# Patient Record
Sex: Female | Born: 1970
Health system: Southern US, Community
[De-identification: ages and names within clinical notes are randomized; demographics above are authoritative.]

## PROBLEM LIST (undated history)

## (undated) DIAGNOSIS — R519 Headache, unspecified: Secondary | ICD-10-CM

## (undated) DIAGNOSIS — F419 Anxiety disorder, unspecified: Secondary | ICD-10-CM

## (undated) DIAGNOSIS — R51 Headache: Secondary | ICD-10-CM

## (undated) DIAGNOSIS — D649 Anemia, unspecified: Secondary | ICD-10-CM

## (undated) DIAGNOSIS — K219 Gastro-esophageal reflux disease without esophagitis: Secondary | ICD-10-CM

## (undated) DIAGNOSIS — J45909 Unspecified asthma, uncomplicated: Secondary | ICD-10-CM

## (undated) DIAGNOSIS — Z8489 Family history of other specified conditions: Secondary | ICD-10-CM

## (undated) HISTORY — DX: Headache: R51

## (undated) HISTORY — DX: Headache, unspecified: R51.9

## (undated) HISTORY — PX: TUBAL LIGATION: SHX77

---

## 2011-02-16 HISTORY — PX: ABDOMINAL HYSTERECTOMY: SHX81

## 2011-12-29 ENCOUNTER — Encounter (HOSPITAL_COMMUNITY): Payer: Self-pay | Admitting: Pharmacist

## 2011-12-30 NOTE — H&P (Addendum)
   Morning presents today for GYN evaluation and second opinion for hysterectomy.  Followed by Dr. Allena Katz in [facility].  She has recently moved.  She does like Dr. Allena Katz a lot but finds that if she needs surgery, it may be better to do it here at [facility].  I reviewed the records with the patient.  It does show that she has had worsening problems with pain and menorrhagia.  Recent ultrasound from October 14 shows a 9.1 x 6.3 cm uterus with several large fibroids, the largest measuring 4.9 cm in size and several 6 mm intramural fibroids.  Normal-appearing ovaries.  I reviewed her other medical records.  Past medical history:  She does have multiple allergies including erythromycin, sulfa and doxycycline, but she can take penicillin.  She has had two Cesarean sections.  They had recommended a hysterectomy, most likely robotic.  She does want a hysterectomy.   A&P: Pelvic pain, menorrhagia, 10-week size fibroids.  Discussed different options.  She has had tubal ligation and she wants to proceed with surgical options such as hysterectomy.  Plan laparoscopically assisted vaginal hysterectomy with preservation of the ovaries.  Discussed the risks and benefits of the procedure, the time out of work.  She does want to proceed as soon as possible.  She did have a history of anemia with a hemoglobin down to 8.  It has improved to 11 on intensive iron therapy as well.   This patient has been seen and examined.   All of her questions were answered.  Labs and vital signs reviewed.  Informed consent has been obtained.  The History and Physical is current.  01/12/12 DL

## 2012-01-04 ENCOUNTER — Encounter (HOSPITAL_COMMUNITY): Payer: Self-pay

## 2012-01-04 ENCOUNTER — Encounter (HOSPITAL_COMMUNITY)
Admission: RE | Admit: 2012-01-04 | Discharge: 2012-01-04 | Disposition: A | Discharge status: Home / Self Care | Payer: BC Managed Care – PPO | Source: Ambulatory Visit | Attending: Obstetrics and Gynecology | Admitting: Obstetrics and Gynecology

## 2012-01-04 ENCOUNTER — Other Ambulatory Visit: Payer: Self-pay | Admitting: Obstetrics and Gynecology

## 2012-01-04 DIAGNOSIS — N63 Unspecified lump in unspecified breast: Secondary | ICD-10-CM

## 2012-01-04 HISTORY — DX: Family history of other specified conditions: Z84.89

## 2012-01-04 HISTORY — DX: Unspecified asthma, uncomplicated: J45.909

## 2012-01-04 HISTORY — DX: Anemia, unspecified: D64.9

## 2012-01-04 HISTORY — DX: Anxiety disorder, unspecified: F41.9

## 2012-01-04 HISTORY — DX: Gastro-esophageal reflux disease without esophagitis: K21.9

## 2012-01-04 LAB — CBC
MCV: 89.8 fL (ref 78.0–100.0)
Platelets: 247 10*3/uL (ref 150–400)
RBC: 4.13 MIL/uL (ref 3.87–5.11)
RDW: 15 % (ref 11.5–15.5)
WBC: 5.5 10*3/uL (ref 4.0–10.5)

## 2012-01-04 LAB — SURGICAL PCR SCREEN
MRSA, PCR: NEGATIVE
Staphylococcus aureus: NEGATIVE

## 2012-01-04 NOTE — Patient Instructions (Addendum)
Your procedure is scheduled on:01/12/12  Enter through the Main Entrance at :6am Pick up desk phone and dial [phone] and inform us of your arrival.  Please call [phone] if you have any problems the morning of surgery.  Remember: Do not eat or drink after midnight:Tuesday   Take these meds the morning of surgery with a sip of water: Zegrid  DO NOT wear jewelry, eye make-up, lipstick,body lotion, or dark fingernail polish. Do not shave for 48 hours prior to surgery.  If you are to be admitted after surgery, leave suitcase in car until your room has been assigned.9

## 2012-01-12 ENCOUNTER — Encounter (HOSPITAL_COMMUNITY): Payer: Self-pay | Admitting: *Deleted

## 2012-01-12 ENCOUNTER — Observation Stay (HOSPITAL_COMMUNITY)
Admission: RE | Admit: 2012-01-12 | Discharge: 2012-01-13 | Disposition: A | Discharge status: Home / Self Care | Payer: BC Managed Care – PPO | Source: Ambulatory Visit | Attending: Obstetrics and Gynecology | Admitting: Obstetrics and Gynecology

## 2012-01-12 ENCOUNTER — Ambulatory Visit (HOSPITAL_COMMUNITY): Payer: BC Managed Care – PPO | Admitting: Anesthesiology

## 2012-01-12 ENCOUNTER — Encounter (HOSPITAL_COMMUNITY)
Admission: RE | Disposition: A | Discharge status: Home / Self Care | Payer: Self-pay | Source: Ambulatory Visit | Attending: Obstetrics and Gynecology

## 2012-01-12 ENCOUNTER — Encounter (HOSPITAL_COMMUNITY): Payer: Self-pay | Admitting: Anesthesiology

## 2012-01-12 DIAGNOSIS — D649 Anemia, unspecified: Secondary | ICD-10-CM | POA: Insufficient documentation

## 2012-01-12 DIAGNOSIS — N938 Other specified abnormal uterine and vaginal bleeding: Principal | ICD-10-CM | POA: Insufficient documentation

## 2012-01-12 DIAGNOSIS — N949 Unspecified condition associated with female genital organs and menstrual cycle: Principal | ICD-10-CM | POA: Insufficient documentation

## 2012-01-12 DIAGNOSIS — N920 Excessive and frequent menstruation with regular cycle: Secondary | ICD-10-CM | POA: Insufficient documentation

## 2012-01-12 DIAGNOSIS — IMO0002 Reserved for concepts with insufficient information to code with codable children: Secondary | ICD-10-CM | POA: Insufficient documentation

## 2012-01-12 DIAGNOSIS — D251 Intramural leiomyoma of uterus: Secondary | ICD-10-CM | POA: Insufficient documentation

## 2012-01-12 DIAGNOSIS — Z90710 Acquired absence of both cervix and uterus: Secondary | ICD-10-CM

## 2012-01-12 HISTORY — PX: LAPAROSCOPIC ASSISTED VAGINAL HYSTERECTOMY: SHX5398

## 2012-01-12 SURGERY — HYSTERECTOMY, VAGINAL, LAPAROSCOPY-ASSISTED
Anesthesia: General | Site: Abdomen | Wound class: Clean Contaminated

## 2012-01-12 MED ORDER — NEOSTIGMINE METHYLSULFATE 1 MG/ML IJ SOLN
INTRAMUSCULAR | Status: DC | PRN
  Administered 2012-01-12: 1 mg via INTRAVENOUS

## 2012-01-12 MED ORDER — LIDOCAINE HCL (CARDIAC) 20 MG/ML IV SOLN
INTRAVENOUS | Status: AC
  Filled 2012-01-12: qty 5

## 2012-01-12 MED ORDER — MIDAZOLAM HCL 2 MG/2ML IJ SOLN: 0.5000 mg | Freq: Once | INTRAMUSCULAR | Status: DC | PRN

## 2012-01-12 MED ORDER — NALOXONE HCL 0.4 MG/ML IJ SOLN: 0.4000 mg | INTRAMUSCULAR | Status: DC | PRN

## 2012-01-12 MED ORDER — FENTANYL CITRATE 0.05 MG/ML IJ SOLN
INTRAMUSCULAR | Status: AC
  Filled 2012-01-12: qty 5

## 2012-01-12 MED ORDER — FLUOXETINE HCL 20 MG PO CAPS
20.0000 mg | ORAL_CAPSULE | Freq: Every day | ORAL | Status: DC
  Administered 2012-01-13: 20 mg via ORAL
  Filled 2012-01-12 (×2): qty 1

## 2012-01-12 MED ORDER — DEXTROSE-NACL 5-0.45 % IV SOLN
INTRAVENOUS | Status: DC
  Administered 2012-01-12 – 2012-01-13 (×3): via INTRAVENOUS

## 2012-01-12 MED ORDER — PROPOFOL 10 MG/ML IV EMUL
INTRAVENOUS | Status: AC
  Filled 2012-01-12: qty 20

## 2012-01-12 MED ORDER — GLYCOPYRROLATE 0.2 MG/ML IJ SOLN
INTRAMUSCULAR | Status: AC
  Filled 2012-01-12: qty 2

## 2012-01-12 MED ORDER — ONDANSETRON HCL 4 MG/2ML IJ SOLN
INTRAMUSCULAR | Status: DC | PRN
  Administered 2012-01-12: 4 mg via INTRAVENOUS

## 2012-01-12 MED ORDER — ACETAMINOPHEN 10 MG/ML IV SOLN
INTRAVENOUS | Status: AC
  Administered 2012-01-12: 1000 mg via INTRAVENOUS
  Filled 2012-01-12: qty 100

## 2012-01-12 MED ORDER — FENTANYL CITRATE 0.05 MG/ML IJ SOLN
25.0000 ug | INTRAMUSCULAR | Status: DC | PRN
  Administered 2012-01-12: 50 ug via INTRAVENOUS

## 2012-01-12 MED ORDER — GLYCOPYRROLATE 0.2 MG/ML IJ SOLN
INTRAMUSCULAR | Status: DC | PRN
  Administered 2012-01-12: 0.2 mg via INTRAVENOUS

## 2012-01-12 MED ORDER — ZOLPIDEM TARTRATE 5 MG PO TABS: 5.0000 mg | ORAL_TABLET | Freq: Every evening | ORAL | Status: DC | PRN

## 2012-01-12 MED ORDER — PROPOFOL 10 MG/ML IV EMUL
INTRAVENOUS | Status: DC | PRN
  Administered 2012-01-12: 120 mg via INTRAVENOUS

## 2012-01-12 MED ORDER — OXYCODONE-ACETAMINOPHEN 5-325 MG PO TABS: 1.0000 | ORAL_TABLET | ORAL | Status: DC | PRN

## 2012-01-12 MED ORDER — BUPIVACAINE HCL (PF) 0.25 % IJ SOLN
INTRAMUSCULAR | Status: AC
  Filled 2012-01-12: qty 30

## 2012-01-12 MED ORDER — HYDROMORPHONE HCL PF 1 MG/ML IJ SOLN: 0.2000 mg | INTRAMUSCULAR | Status: DC | PRN

## 2012-01-12 MED ORDER — DIPHENHYDRAMINE HCL 50 MG/ML IJ SOLN: 12.5000 mg | Freq: Four times a day (QID) | INTRAMUSCULAR | Status: DC | PRN

## 2012-01-12 MED ORDER — BUPIVACAINE HCL (PF) 0.25 % IJ SOLN
INTRAMUSCULAR | Status: DC | PRN
  Administered 2012-01-12: 10 mL

## 2012-01-12 MED ORDER — DIPHENHYDRAMINE HCL 12.5 MG/5ML PO ELIX: 12.5000 mg | ORAL_SOLUTION | Freq: Four times a day (QID) | ORAL | Status: DC | PRN

## 2012-01-12 MED ORDER — NEOSTIGMINE METHYLSULFATE 1 MG/ML IJ SOLN
INTRAMUSCULAR | Status: AC
  Filled 2012-01-12: qty 10

## 2012-01-12 MED ORDER — MIDAZOLAM HCL 2 MG/2ML IJ SOLN
INTRAMUSCULAR | Status: AC
  Filled 2012-01-12: qty 2

## 2012-01-12 MED ORDER — MENTHOL 3 MG MT LOZG: 1.0000 | LOZENGE | OROMUCOSAL | Status: DC | PRN

## 2012-01-12 MED ORDER — LACTATED RINGERS IV SOLN
INTRAVENOUS | Status: DC
  Administered 2012-01-12 (×3): via INTRAVENOUS

## 2012-01-12 MED ORDER — MEPERIDINE HCL 25 MG/ML IJ SOLN: 6.2500 mg | INTRAMUSCULAR | Status: DC | PRN

## 2012-01-12 MED ORDER — PROMETHAZINE HCL 25 MG/ML IJ SOLN
6.2500 mg | INTRAMUSCULAR | Status: DC | PRN
  Administered 2012-01-12: 6.25 mg via INTRAVENOUS

## 2012-01-12 MED ORDER — DEXTROSE 5 % IV SOLN
2.0000 g | INTRAVENOUS | Status: AC
  Administered 2012-01-12: 2 g via INTRAVENOUS
  Filled 2012-01-12: qty 2

## 2012-01-12 MED ORDER — ONDANSETRON 4 MG PO TBDP
4.0000 mg | ORAL_TABLET | Freq: Four times a day (QID) | ORAL | Status: DC | PRN
  Filled 2012-01-12: qty 1

## 2012-01-12 MED ORDER — MIDAZOLAM HCL 5 MG/5ML IJ SOLN
INTRAMUSCULAR | Status: DC | PRN
  Administered 2012-01-12: 2 mg via INTRAVENOUS

## 2012-01-12 MED ORDER — ROCURONIUM BROMIDE 50 MG/5ML IV SOLN
INTRAVENOUS | Status: AC
  Filled 2012-01-12: qty 1

## 2012-01-12 MED ORDER — DEXTROSE 5 % IV SOLN
2.0000 g | Freq: Once | INTRAVENOUS | Status: DC
  Filled 2012-01-12: qty 2

## 2012-01-12 MED ORDER — PROMETHAZINE HCL 25 MG/ML IJ SOLN
INTRAMUSCULAR | Status: AC
  Administered 2012-01-12: 6.25 mg via INTRAVENOUS
  Filled 2012-01-12: qty 1

## 2012-01-12 MED ORDER — CLONAZEPAM 0.5 MG PO TABS
0.5000 mg | ORAL_TABLET | Freq: Every day | ORAL | Status: DC
  Administered 2012-01-12: 0.25 mg via ORAL
  Filled 2012-01-12: qty 1

## 2012-01-12 MED ORDER — SODIUM CHLORIDE 0.9 % IJ SOLN: 9.0000 mL | INTRAMUSCULAR | Status: DC | PRN

## 2012-01-12 MED ORDER — FENTANYL CITRATE 0.05 MG/ML IJ SOLN
INTRAMUSCULAR | Status: AC
  Administered 2012-01-12: 50 ug via INTRAVENOUS
  Filled 2012-01-12: qty 2

## 2012-01-12 MED ORDER — ONDANSETRON HCL 4 MG/2ML IJ SOLN
INTRAMUSCULAR | Status: AC
  Filled 2012-01-12: qty 2

## 2012-01-12 MED ORDER — FENTANYL CITRATE 0.05 MG/ML IJ SOLN
INTRAMUSCULAR | Status: DC | PRN
  Administered 2012-01-12 (×5): 50 ug via INTRAVENOUS

## 2012-01-12 MED ORDER — HYDROMORPHONE 0.3 MG/ML IV SOLN
INTRAVENOUS | Status: DC
  Administered 2012-01-12 (×2): 0.2 mg via INTRAVENOUS
  Administered 2012-01-12: 12:00:00 via INTRAVENOUS
  Administered 2012-01-13: 0.2 mg via INTRAVENOUS
  Filled 2012-01-12: qty 25

## 2012-01-12 MED ORDER — LACTATED RINGERS IR SOLN
Status: DC | PRN
  Administered 2012-01-12: 3000 mL

## 2012-01-12 MED ORDER — ROCURONIUM BROMIDE 100 MG/10ML IV SOLN
INTRAVENOUS | Status: DC | PRN
  Administered 2012-01-12: 5 mg via INTRAVENOUS
  Administered 2012-01-12 (×2): 10 mg via INTRAVENOUS
  Administered 2012-01-12: 35 mg via INTRAVENOUS

## 2012-01-12 MED ORDER — ONDANSETRON HCL 4 MG/2ML IJ SOLN: 4.0000 mg | Freq: Four times a day (QID) | INTRAMUSCULAR | Status: DC | PRN

## 2012-01-12 MED ORDER — ACETAMINOPHEN 10 MG/ML IV SOLN
1000.0000 mg | Freq: Four times a day (QID) | INTRAVENOUS | Status: DC
  Administered 2012-01-12 (×2): 1000 mg via INTRAVENOUS

## 2012-01-12 SURGICAL SUPPLY — 40 items
BLADE SURG 15 STRL LF C SS BP (BLADE) ×1 IMPLANT
BLADE SURG 15 STRL SS (BLADE) ×1
CABLE HIGH FREQUENCY MONO STRZ (ELECTRODE) IMPLANT
CATH ROBINSON RED A/P 16FR (CATHETERS) ×2 IMPLANT
CLOTH BEACON ORANGE TIMEOUT ST (SAFETY) ×2 IMPLANT
CONT PATH 16OZ SNAP LID 3702 (MISCELLANEOUS) ×2 IMPLANT
COVER TABLE BACK 60X90 (DRAPES) ×2 IMPLANT
DECANTER SPIKE VIAL GLASS SM (MISCELLANEOUS) IMPLANT
DERMABOND ADVANCED (GAUZE/BANDAGES/DRESSINGS) ×1
DERMABOND ADVANCED .7 DNX12 (GAUZE/BANDAGES/DRESSINGS) ×1 IMPLANT
ELECT LIGASURE LONG (ELECTRODE) ×2 IMPLANT
ELECT REM PT RETURN 9FT ADLT (ELECTROSURGICAL) ×2
ELECTRODE REM PT RTRN 9FT ADLT (ELECTROSURGICAL) ×1 IMPLANT
FORCEPS CUTTING 45CM 5MM (CUTTING FORCEPS) ×2 IMPLANT
GLOVE BIO SURGEON STRL SZ8 (GLOVE) ×2 IMPLANT
GLOVE BIOGEL PI IND STRL 6.5 (GLOVE) ×1 IMPLANT
GLOVE BIOGEL PI INDICATOR 6.5 (GLOVE) ×1
GLOVE SURG ORTHO 8.0 STRL STRW (GLOVE) ×6 IMPLANT
GOWN STRL REIN XL XLG (GOWN DISPOSABLE) ×8 IMPLANT
NEEDLE INSUFFLATION 14GA 120MM (NEEDLE) ×2 IMPLANT
NS IRRIG 1000ML POUR BTL (IV SOLUTION) ×2 IMPLANT
PACK LAVH (CUSTOM PROCEDURE TRAY) ×2 IMPLANT
PROTECTOR NERVE ULNAR (MISCELLANEOUS) ×2 IMPLANT
SET IRRIG TUBING LAPAROSCOPIC (IRRIGATION / IRRIGATOR) ×2 IMPLANT
SOLUTION ELECTROLUBE (MISCELLANEOUS) IMPLANT
STRIP CLOSURE SKIN 1/4X3 (GAUZE/BANDAGES/DRESSINGS) IMPLANT
SUT MNCRL 0 MO-4 VIOLET 18 CR (SUTURE) ×2 IMPLANT
SUT MNCRL 0 VIOLET 6X18 (SUTURE) ×1 IMPLANT
SUT MNCRL AB 0 CT1 27 (SUTURE) IMPLANT
SUT MON AB 2-0 CT1 36 (SUTURE) IMPLANT
SUT MONOCRYL 0 6X18 (SUTURE) ×1
SUT MONOCRYL 0 MO 4 18  CR/8 (SUTURE) ×2
SUT VICRYL 0 UR6 27IN ABS (SUTURE) ×2 IMPLANT
SUT VICRYL RAPIDE 3 0 (SUTURE) ×2 IMPLANT
TOWEL OR 17X24 6PK STRL BLUE (TOWEL DISPOSABLE) ×4 IMPLANT
TRAY FOLEY CATH 14FR (SET/KITS/TRAYS/PACK) ×2 IMPLANT
TROCAR Z-THREAD BLADED 11X100M (TROCAR) ×2 IMPLANT
TROCAR Z-THREAD BLADED 5X100MM (TROCAR) ×2 IMPLANT
WARMER LAPAROSCOPE (MISCELLANEOUS) ×2 IMPLANT
WATER STERILE IRR 1000ML POUR (IV SOLUTION) IMPLANT

## 2012-01-12 NOTE — Brief Op Note (Addendum)
01/12/2012  9:23 AM  PATIENT:  Amanda Ho  41 y.o. female  PRE-OPERATIVE DIAGNOSIS:  10 week fibroids, AUB, anemia,menorrhagia,dyspareunia  POST-OPERATIVE DIAGNOSIS:  Ten week fibroids, Abnormal Uterine Bleeding, Anemia, menorrhagia, dyspareunia  PROCEDURE:  Procedure(s) (LRB) with comments: LAPAROSCOPIC ASSISTED VAGINAL HYSTERECTOMY (N/A)  SURGEON:  Surgeon(s) and Role:    * Turner Daniels, MD - Primary    * Mitchel Honour, DO - Assisting  PHYSICIAN ASSISTANT:   ASSISTANTS: none   ANESTHESIA:   general  EBL:  Total I/O In: 1300 [I.V.:1300] Out: 500 [Urine:300; Blood:200]  BLOOD ADMINISTERED:none  DRAINS: Urinary Catheter (Foley)   LOCAL MEDICATIONS USED:  MARCAINE     SPECIMEN:  Source of Specimen:  uterus  DISPOSITION OF SPECIMEN:  PATHOLOGY  COUNTS:  YES  TOURNIQUET:  * No tourniquets in log *  DICTATION: .Other Dictation: Dictation Number [id number]  PLAN OF CARE: Admit for overnight observation  PATIENT DISPOSITION:  PACU - hemodynamically stable.   Delay start of Pharmacological VTE agent (>24hrs) due to surgical blood loss or risk of bleeding: N/A

## 2012-01-12 NOTE — Addendum Note (Signed)
Addendum  created 01/12/12 1204 by Collier Flowers, CRNA   Modules edited:Anesthesia Responsible Staff

## 2012-01-12 NOTE — Addendum Note (Signed)
Addendum  created 01/12/12 1143 by Zaiyden Strozier J Nyheim Seufert, CRNA   Modules edited:Notes Section    

## 2012-01-12 NOTE — Addendum Note (Signed)
Addendum  created 01/12/12 1143 by Collier Flowers, CRNA   Modules edited:Notes Section

## 2012-01-12 NOTE — Anesthesia Postprocedure Evaluation (Signed)
  Anesthesia Post-op Note  Patient: Amanda Ho  Procedure(s) Performed: Procedure(s) (LRB) with comments: LAPAROSCOPIC ASSISTED VAGINAL HYSTERECTOMY (N/A)  Patient Location: [facility]  Anesthesia Type:General  Level of Consciousness: awake, alert  and oriented  Airway and Oxygen Therapy: Patient Spontanous Breathing and Patient connected to nasal cannula oxygen  Post-op Pain: none  Post-op Assessment: Post-op Vital signs reviewed and Patient's Cardiovascular Status Stable  Post-op Vital Signs: Reviewed and stable  Complications: No apparent anesthesia complications

## 2012-01-12 NOTE — Addendum Note (Signed)
Addendum  created 01/12/12 1800 by Shanon Payor, CRNA   Modules edited:Notes Section

## 2012-01-12 NOTE — Anesthesia Postprocedure Evaluation (Signed)
Anesthesia Post Note  Patient: Amanda Ho  Procedure(s) Performed: Procedure(s) (LRB): LAPAROSCOPIC ASSISTED VAGINAL HYSTERECTOMY (N/A)  Anesthesia type: General  Patient location: PACU  Post pain: Pain level controlled  Post assessment: Post-op Vital signs reviewed  Last Vitals:  Filed Vitals:   01/12/12 1100  BP: 116/61  Pulse: 70  Temp:   Resp: 12    Post vital signs: Reviewed  Level of consciousness: sedated  Complications: No apparent anesthesia complicationsfj

## 2012-01-12 NOTE — Preoperative (Addendum)
Beta Blockers   Reason not to administer Beta Blockers:Not Applicable 

## 2012-01-12 NOTE — Transfer of Care (Signed)
Immediate Anesthesia Transfer of Care Note  Patient: Amanda Ho  Procedure(s) Performed: Procedure(s) (LRB) with comments: LAPAROSCOPIC ASSISTED VAGINAL HYSTERECTOMY (N/A)  Patient Location: PACU  Anesthesia Type:General  Level of Consciousness: awake, alert , oriented and patient cooperative  Airway & Oxygen Therapy: Patient Spontanous Breathing and Patient connected to nasal cannula oxygen  Post-op Assessment: Report given to PACU RN and Post -op Vital signs reviewed and stable  Post vital signs: Reviewed and stable  Complications: No apparent anesthesia complications32

## 2012-01-12 NOTE — Op Note (Signed)
[name], Ho NO.:  [id number]  MEDICAL RECORD NO.:  [id number]  LOCATION:  9318                          FACILITY:  [facility]  PHYSICIAN:  Dineen Kid. Rana Snare, M.D.    DATE OF BIRTH:  09-04-1970  DATE OF PROCEDURE:  01/12/2012 DATE OF DISCHARGE:                              OPERATIVE REPORT   PREOPERATIVE DIAGNOSIS:  Ten week size fibroids, abnormal uterine bleeding, anemia, menorrhagia, and dyspareunia.  POSTOPERATIVE DIAGNOSIS:  Ten week size fibroids, abnormal uterine bleeding, anemia, menorrhagia, and dyspareunia.  PROCEDURE:  Laparoscopic-assisted vaginal hysterectomy with lysis of adhesions.  SURGEON:  Dineen Kid. Rana Snare, MD  ASSISTANT:  Dr. Gillian Scarce.  ANESTHESIA:  General endotracheal.  INDICATIONS:  [name] is a 41 year old status post tubal ligation, who has been followed by Dr. Allena Katz in [facility].  She was rescheduled for hysterectomy but had to move.  Had been having worsening problems with menorrhagia and anemia, currently on intensive iron therapy.  Recent ultrasound shows a 9.1 cm uterus with several large fibroids, the largest measuring 4.9 cm in size.  She has also continued to have pelvic pain and dyspareunia.  She desires definitive surgical intervention and requests hysterectomy.  Plan to proceed with laparoscopic-assisted vaginal hysterectomy, preservation of the ovaries.  Discussed the risks, benefits, the procedure at length.  She does give her informed consent and wished to proceed.  FINDINGS:  At the time of surgery normal-appearing liver, normal- appearing gallbladder, normal-appearing appendix.  The ovaries appeared to be normal.  The uterus is grossly enlarged, approximately 10 week size, slightly boggy in nature, and irregular consistent with fibroids. There are some epiploic adhesions to the left tubo-ovarian complex and the posterior wall of the uterus, otherwise normal-appearing bowel.  DESCRIPTION OF PROCEDURE:  After  adequate analgesia, the patient was placed in the dorsal lithotomy position.  She was sterilely prepped and draped.  Bladder sterilely drained.  Graves speculum was placed.  A Cohen tenaculum was placed on the cervix.  A 1 cm infraumbilical skin incision was made.  A Veress needle was inserted.  The abdomen was insufflated with dullness to percussion.  An 11 mm trocar was inserted. The above findings were noted by laparoscope.  A 5 mm trocar was inserted left of the midline 2 fingerbreadths above the pubic symphysis. The Gyrus cutting forceps were used to dissect and remove the epiploic adhesions from the posterior wall of the uterus and the left tubo- ovarian complex.  Care taken to avoid injury to the bowel, and she had good hemostasis.  The left utero-ovarian ligament was then ligated and dissected down across the broad ligament, including the round ligament with the left tube and ovary falling to the lateral sidewall.  Good hemostasis achieved and care taken to avoid not only bowel injury but also to avoid injury to ureter.  This was done similarly on the right side, dissecting across the utero-ovarian ligament down across the round and the inferior portion of broad ligament with the right tube and ovary falling laterally with care taken to avoid injury to the ureter or bowel and achieve good hemostasis.  The bladder was then elevated and a small window was made  at the uterovesical junction creating a small bladder flap.  The abdomen was desufflated, legs repositioned.  A posterior weighted speculum was placed in the vagina.  Posterior colpotomy was then performed.  The cervix then circumscribed with Bovie cautery. LigaSure instrument was used to ligate across the uterosacral ligaments bilaterally, cardinal ligaments bilaterally, bladder pillars bilaterally and dissected with Mayo scissors.  The bladder was then dissected off the anterior surface of the cervix.  The anterior peritoneum  was entered sharply and a Deaver retractor was placed underneath the bladder.  The uterine vasculature was then ligated with LigaSure instrument, dissected with Mayo scissors to the inferior portion of the broad ligament using ligature and Mayo scissors.  The uterus then removed intact.  The uterosacral ligaments were identified, ligated with a figure-of-eight of 0 Monocryl suture bilaterally.  The posterior peritoneum was then closed in a pursestring fashion using 0 Monocryl suture.  The vagina was then closed by plicating the uterosacral ligaments in the midline in a vertical fashion closing the vagina with figure-of-eights of 0 Monocryl suture with good approximation and good hemostasis achieved.  Foley catheter was placed with return of clear yellow urine.  That was then re- insufflated.  The laparoscope was inserted.  Nezhat suction irrigator was used to irrigate the abdomen and pelvis.  Small peritoneal bleeders along the bladder were cauterized using bipolar cautery, achieving good hemostasis.  Reexamination revealed good hemostasis of all the pedicles. No bleeding was noted after meticulous evaluation of the small bleeders and use of bipolar cautery.  Ureter was identified bilaterally with good peristalsis noted.  After copious irrigation, adequate hemostasis was assured, the abdomen was then desufflated, trocars removed.  The infraumbilical skin incision was closed with 0 Vicryl interrupted suture in the fascia, 3-0 Vicryl Rapide subcuticular suture.  The 5 mm site was closed with 3-0 Vicryl Rapide subcuticular suture and Dermabond.  The incisions were injected with 0.25% Marcaine, total 10 mL used.  The patient transferred to recovery room in stable condition.  Sponge and instrument count was normal x3.  Estimated blood loss 200 mL.  The patient received 2 g of cefotetan preoperatively.  DISPOSITION:  The patient will be placed on the 3rd floor for overnight observation in  stable condition.     Dineen Kid Rana Snare, M.D.     DCL/MEDQ  D:  01/12/2012  T:  01/12/2012  Job:  [id number]

## 2012-01-12 NOTE — Anesthesia Preprocedure Evaluation (Signed)
Anesthesia Evaluation  Patient identified by MRN, date of birth, ID band Patient awake    Reviewed: Allergy & Precautions, H&P , Patient's Chart, lab work & pertinent test results, reviewed documented beta blocker date and time   History of Anesthesia Complications (+) Family history of anesthesia reaction  Airway Mallampati: II TM Distance: >3 FB Neck ROM: full    Dental No notable dental hx.    Pulmonary neg pulmonary ROS, asthma ,  breath sounds clear to auscultation  Pulmonary exam normal       Cardiovascular Exercise Tolerance: Good negative cardio ROS  Rhythm:regular Rate:Normal     Neuro/Psych  Headaches, negative neurological ROS  negative psych ROS   GI/Hepatic negative GI ROS, Neg liver ROS, GERD-  Controlled,  Endo/Other  negative endocrine ROS  Renal/GU negative Renal ROS     Musculoskeletal   Abdominal   Peds  Hematology negative hematology ROS (+)   Anesthesia Other Findings GERD (gastroesophageal reflux disease)     Anxiety        Asthma   rarely with URIs Headache        Anemia     Family history of anesthesia complication   Vertigo postop    Reproductive/Obstetrics negative OB ROS                           Anesthesia Physical Anesthesia Plan  ASA: II  Anesthesia Plan: General ETT   Post-op Pain Management:    Induction:   Airway Management Planned:   Additional Equipment:   Intra-op Plan:   Post-operative Plan:   Informed Consent: I have reviewed the patients History and Physical, chart, labs and discussed the procedure including the risks, benefits and alternatives for the proposed anesthesia with the patient or authorized representative who has indicated his/her understanding and acceptance.   Dental Advisory Given  Plan Discussed with: CRNA and Surgeon  Anesthesia Plan Comments:         Anesthesia Quick Evaluation

## 2012-01-12 NOTE — Addendum Note (Signed)
Addendum  created 01/12/12 1204 by Keenan Dimitrov J Waunetta Riggle, CRNA   Modules edited:Anesthesia Responsible Staff    

## 2012-01-13 ENCOUNTER — Encounter (HOSPITAL_COMMUNITY): Payer: Self-pay | Admitting: Obstetrics and Gynecology

## 2012-01-13 LAB — CBC
HCT: 32.8 % — ABNORMAL LOW (ref 36.0–46.0)
MCHC: 31.4 g/dL (ref 30.0–36.0)
RDW: 15.4 % (ref 11.5–15.5)
WBC: 7.8 10*3/uL (ref 4.0–10.5)

## 2012-01-13 LAB — IRON AND TIBC
Iron: 202 ug/dL — ABNORMAL HIGH (ref 42–135)
Saturation Ratios: 73 % — ABNORMAL HIGH (ref 20–55)
UIBC: 73 ug/dL — ABNORMAL LOW (ref 125–400)

## 2012-01-13 LAB — FERRITIN: Ferritin: 11 ng/mL (ref 10–291)

## 2012-01-13 MED ORDER — SIMETHICONE 80 MG PO CHEW
80.0000 mg | CHEWABLE_TABLET | Freq: Four times a day (QID) | ORAL | Status: DC | PRN
  Administered 2012-01-13: 80 mg via ORAL

## 2012-01-13 MED ORDER — OXYCODONE-ACETAMINOPHEN 5-325 MG PO TABS: 1.0000 | ORAL_TABLET | ORAL | Status: DC | PRN

## 2012-01-13 MED ORDER — PANTOPRAZOLE SODIUM 40 MG PO PACK
40.0000 mg | PACK | Freq: Every day | ORAL | Status: DC
  Administered 2012-01-13: 40 mg via ORAL
  Filled 2012-01-13: qty 20

## 2012-01-13 NOTE — Progress Notes (Signed)
Pt  Out in wheelchair  Teaching  Complete  

## 2012-01-13 NOTE — Discharge Summary (Signed)
Physician Discharge Summary  Patient ID: Amanda Ho MRN: [id number] DOB/AGE: [birth year] 40 y.o.  Admit date: 01/12/2012 Discharge date: 01/13/2012  Admission Diagnoses:Ten week fibroids, Abnormal Uterine Bleeding, Anemia, menorrhagia, dyspareunia   Discharge Diagnoses: Ten week fibroids, Abnormal Uterine Bleeding, Anemia, menorrhagia, dyspareunia  Active Problems:  * No active hospital problems. *    Discharged Condition: good  Hospital Course: Pt underwent LAVH with LOA without complications.  Her postoperative care was unremarkable with pod 1 Hgb 10.3.  She was ambulating well, tolerating po, pain was well controlled without meds.  She desired d/c home.  Consults: None  Significant Diagnostic Studies: labs: hgb 10.3  Treatments: surgery: LAVH, LOA  Discharge Exam: Blood pressure 99/57, pulse 76, temperature 98.7 F (37.1 C), temperature source Oral, resp. rate 13, height 5\' 3"  (1.6 m), weight 58.06 kg (128 lb), last menstrual period 12/21/2011, SpO2 100.00%. General appearance: alert, cooperative, appears stated age and no distress GI: soft, non-tender; bowel sounds normal; no masses,  no organomegaly Incision/Wound:C,D and intact  Disposition: Final discharge disposition not confirmed  Discharge Orders    Future Appointments: Provider: Department: Dept Phone: Center:   06/20/2012 8:00 AM [facility]  IMAGING [phone] [facility]   06/20/2012 8:10 AM [facility] [place] [facility] [place]  IMAGING [phone] [facility]     Future Orders Please Complete By Expires   Diet general      Increase activity slowly      Driving Restrictions      Comments:   No driving for 1 weeks   Lifting restrictions      Comments:   No lifting anything greater than 10 pounds (if you have to ask, don't lift it)   Sexual Activity Restrictions      Comments:   Nothing in the vagina for 6 weeks   Call MD for:  temperature >100.4       Call MD for:  persistant nausea and vomiting      Call MD for:  severe uncontrolled pain      Call MD for:  redness, tenderness, or signs of infection (pain, swelling, redness, odor or green/yellow discharge around incision site)      Call MD for:  difficulty breathing, headache or visual disturbances          Medication List     As of 01/13/2012  9:19 AM    TAKE these medications         acetaminophen 500 MG tablet   Commonly known as: TYLENOL   Take 1,000 mg by mouth every 6 (six) hours as needed. For headache      clonazePAM 0.5 MG tablet   Commonly known as: KLONOPIN   Take 0.5 mg by mouth at bedtime.      [facility] IRON 45 MG Tabs   Generic drug: carbonyl iron   Take 45 mg by mouth daily. Slow release      flintstones complete 60 MG chewable tablet   Chew 1 tablet by mouth daily.      FLUoxetine 20 MG capsule   Commonly known as: PROZAC   Take 20 mg by mouth daily.      omeprazole-sodium bicarbonate 40-1100 MG per capsule   Commonly known as: ZEGERID   Take 1 capsule by mouth daily before breakfast.      ondansetron 4 MG disintegrating tablet   Commonly known as: ZOFRAN-ODT   Take 4 mg by mouth every 6 (six) hours  as needed. For nausea      oxyCODONE-acetaminophen 5-325 MG per tablet   Commonly known as: PERCOCET/ROXICET   Take 1-2 tablets by mouth every 4 (four) hours as needed (moderate to severe pain (when tolerating fluids)).         Signed: Saige Canton C 01/13/2012, 9:19 AM

## 2012-01-13 NOTE — Progress Notes (Signed)
1 Day Post-Op Procedure(s) (LRB): LAPAROSCOPIC ASSISTED VAGINAL HYSTERECTOMY (N/A)  Subjective: Patient reports tolerating PO, + flatus and no problems voiding.    Objective: I have reviewed patient's vital signs, intake and output, medications and labs.  General: alert, cooperative, appears older than stated age and no distress GI: soft, non-tender; bowel sounds normal; no masses,  no organomegaly and incision: clean, dry and intact  Assessment: s/p Procedure(s) (LRB) with comments: LAPAROSCOPIC ASSISTED VAGINAL HYSTERECTOMY (N/A): stable, progressing well and tolerating diet  Plan: Discontinue IV fluids Discharge home  LOS: 1 day    Amanda Ho C 01/13/2012, 9:13 AM

## 2012-06-20 ENCOUNTER — Other Ambulatory Visit: Payer: BC Managed Care – PPO

## 2012-06-30 ENCOUNTER — Ambulatory Visit
Admission: RE | Admit: 2012-06-30 | Discharge: 2012-06-30 | Disposition: A | Discharge status: Home / Self Care | Payer: BC Managed Care – PPO | Source: Ambulatory Visit | Attending: Obstetrics and Gynecology | Admitting: Obstetrics and Gynecology

## 2012-06-30 DIAGNOSIS — N63 Unspecified lump in unspecified breast: Secondary | ICD-10-CM

## 2013-06-06 ENCOUNTER — Other Ambulatory Visit: Payer: Self-pay | Admitting: Obstetrics and Gynecology

## 2013-06-06 DIAGNOSIS — IMO0002 Reserved for concepts with insufficient information to code with codable children: Secondary | ICD-10-CM

## 2013-06-06 DIAGNOSIS — R229 Localized swelling, mass and lump, unspecified: Principal | ICD-10-CM

## 2013-06-06 DIAGNOSIS — N6320 Unspecified lump in the left breast, unspecified quadrant: Secondary | ICD-10-CM

## 2013-07-05 ENCOUNTER — Encounter (INDEPENDENT_AMBULATORY_CARE_PROVIDER_SITE_OTHER): Payer: Self-pay

## 2013-07-05 ENCOUNTER — Ambulatory Visit
Admission: RE | Admit: 2013-07-05 | Discharge: 2013-07-05 | Disposition: A | Discharge status: Home / Self Care | Payer: No Typology Code available for payment source | Source: Ambulatory Visit | Attending: Obstetrics and Gynecology | Admitting: Obstetrics and Gynecology

## 2013-07-05 DIAGNOSIS — N6320 Unspecified lump in the left breast, unspecified quadrant: Secondary | ICD-10-CM

## 2015-03-25 DIAGNOSIS — F410 Panic disorder [episodic paroxysmal anxiety] without agoraphobia: Secondary | ICD-10-CM | POA: Insufficient documentation

## 2015-03-25 DIAGNOSIS — F3342 Major depressive disorder, recurrent, in full remission: Secondary | ICD-10-CM | POA: Insufficient documentation

## 2015-11-03 ENCOUNTER — Ambulatory Visit: Payer: No Typology Code available for payment source | Admitting: Primary Care

## 2015-11-05 ENCOUNTER — Ambulatory Visit (INDEPENDENT_AMBULATORY_CARE_PROVIDER_SITE_OTHER): Payer: BLUE CROSS/BLUE SHIELD | Admitting: Primary Care

## 2015-11-05 ENCOUNTER — Encounter: Payer: Self-pay | Admitting: Primary Care

## 2015-11-05 VITALS — BP 106/68 | HR 85 | Temp 98.1°F | Ht 64.0 in | Wt 146.4 lb

## 2015-11-05 DIAGNOSIS — K219 Gastro-esophageal reflux disease without esophagitis: Secondary | ICD-10-CM | POA: Diagnosis not present

## 2015-11-05 DIAGNOSIS — F411 Generalized anxiety disorder: Secondary | ICD-10-CM

## 2015-11-05 MED ORDER — HYDROXYZINE HCL 25 MG PO TABS: ORAL_TABLET | ORAL | 0 refills | Status: DC

## 2015-11-05 MED ORDER — CLONAZEPAM 0.5 MG PO TABS: ORAL_TABLET | ORAL | 0 refills | Status: DC

## 2015-11-05 MED ORDER — FLUOXETINE HCL 20 MG PO CAPS: 20.0000 mg | ORAL_CAPSULE | Freq: Every day | ORAL | 3 refills | Status: DC

## 2015-11-05 NOTE — Progress Notes (Signed)
Subjective:    Patient ID: Amanda Ho, female    DOB: 13-Nov-1970, 45 y.o.   MRN: [id number]  HPI  Amanda Ho is a 45 year old female who presents today to establish care and discuss the problems mentioned below. Will obtain old records. Her last physical was 6 months ago.   1) Anxiety Disorder: Diagnosed in 2007. Currently managed on Fluoxetine 20 mg and Clonazepam 0.5 mg at bedtime. She was previously managed on Cymbalta, Zoloft, and another unknown medication, but fluoxetine has worked the best. She's been taking medication since diagnosis. She takes the Clonazepam every night at bedtime as she feels anxious. The medication will "keep me going" until the next night. She will experience chest heaviness without her medication. She presented to her previous primary care provider last year requesting to be weaned down from medication, but her PCP discouraged this. Denies SI/HI.  2) GERD: Diagnosed years ago. Currently managed on Zegerid since 2002. She will experience constipation/diarrhea, vomiting, abdominal pain, inability to eat without her medication. Dairy is a major trigger for which she avoids.  Review of Systems  Respiratory: Negative for shortness of breath.   Cardiovascular: Negative for chest pain.  Gastrointestinal:       GERD  Neurological: Negative for dizziness and headaches.  Psychiatric/Behavioral: Negative for sleep disturbance and suicidal ideas. The patient is nervous/anxious.        Past Medical History:  Diagnosis Date  . Anemia   . Anxiety   . Asthma    rarely with URIs  . Family history of anesthesia complication    Vertigo postop  . Frequent headaches   . GERD (gastroesophageal reflux disease)      Social History   Social History  . Marital status: Married    Spouse name: N/A  . Number of children: N/A  . Years of education: N/A   Occupational History  . Not on file.   Social History Main Topics  . Smoking status: Never Smoker  .  Smokeless tobacco: Not on file  . Alcohol use 0.6 oz/week    1 Glasses of wine per week     Comment: socially  . Drug use: No  . Sexual activity: Not on file   Other Topics Concern  . Not on file   Social History Narrative   Married.   2 children. 4 grandchildren.   Works as a Herbalist.    Enjoys reading, going to the farmers market, relaxing.    Past Surgical History:  Procedure Laterality Date  . ABDOMINAL HYSTERECTOMY  2013  . CESAREAN SECTION     x2   . LAPAROSCOPIC ASSISTED VAGINAL HYSTERECTOMY  01/12/2012   Procedure: LAPAROSCOPIC ASSISTED VAGINAL HYSTERECTOMY;  Surgeon: Luz Lex, MD;  Location: [facility];  Service: Gynecology;  Laterality: N/A;  . TUBAL LIGATION      Family History  Problem Relation Age of Onset  . Arthritis Mother   . Breast cancer Mother   . Mental illness Mother   . Mental illness Father   . Hypertension Brother   . Breast cancer Maternal Grandmother   . Heart disease Maternal Grandmother   . Hypertension Maternal Grandmother   . Alcohol abuse Maternal Grandfather     Allergies  Allergen Reactions  . Augmentin [Amoxicillin-Pot Clavulanate] Nausea And Vomiting  . Doxycycline Nausea And Vomiting  . Erythromycin Nausea And Vomiting  . Etodolac Other (See Comments)    Dizziness  . Flagyl [Metronidazole] Nausea And Vomiting  Both oral and cream   . Hydrocodone-Acetaminophen Nausea And Vomiting  . Ibuprofen Nausea And Vomiting  . Morphine And Related Other (See Comments)    Sleeps hard, can't wake up  . Prednisone Nausea And Vomiting  . Sulfa Antibiotics Nausea And Vomiting  . Talwin [Pentazocine] Nausea And Vomiting  . Sudafed [Pseudoephedrine Hcl] Palpitations    Tachycardia    Current Outpatient Prescriptions on File Prior to Visit  Medication Sig Dispense Refill  . omeprazole-sodium bicarbonate (ZEGERID) 40-1100 MG per capsule Take 1 capsule by mouth daily before breakfast.    . ondansetron (ZOFRAN-ODT) 4 MG  disintegrating tablet Take 4 mg by mouth every 6 (six) hours as needed. For nausea     No current facility-administered medications on file prior to visit.     BP 106/68   Pulse 85   Temp 98.1 F (36.7 C) (Oral)   Ht 5\' 4"  (1.626 m)   Wt 146 lb 6.4 oz (66.4 kg)   LMP 12/21/2011   SpO2 98%   BMI 25.13 kg/m    Objective:   Physical Exam  Constitutional: She appears well-nourished.  Neck: Neck supple.  Cardiovascular: Normal rate and regular rhythm.   Pulmonary/Chest: Effort normal and breath sounds normal.  Skin: Skin is warm and dry.  Psychiatric: She has a normal mood and affect.          Assessment & Plan:

## 2015-11-05 NOTE — Assessment & Plan Note (Signed)
Currently managed on Prozac and clonazepam. Discussed long-term effects of clonazepam use and discouraged use of this medication. Plan is to wean off clonazepam, refill provided today for her to start one half tablet at bedtime. Will have her try hydroxyzine to use in place of clonazepam as needed for anxiety and sleep. Will call patient in the next 2 weeks with an update. Continue Prozac.

## 2015-11-05 NOTE — Assessment & Plan Note (Signed)
Long history of, managed on Zegerid. Symptomatic without medication, continue.

## 2015-11-05 NOTE — Progress Notes (Signed)
Pre visit review using our clinic review tool, if applicable. No additional management support is needed unless otherwise documented below in the visit note. 

## 2015-11-05 NOTE — Patient Instructions (Signed)
I would like to wean you off of the Clonazepam. Start by taking 1/2 tablet by mouth at bedtime as needed, then reduce to 1/4 tablet as needed.  In the meantime, try hydroxyzine 25 mg tablets. Take 1 tablet by mouth at bedtime as needed for anxiety and sleep. You may have to take this earlier than you did with the Clonazepam.  Please update me in 2 weeks.  I sent refills of Fluoxetine to your pharmacy.  Please call me if you need me to place an order for your mammogram.  Follow up in 6 months for your annual physical exam.  It was a pleasure to meet you today! Please don't hesitate to call me with any questions. Welcome to [facility]!

## 2015-11-09 ENCOUNTER — Encounter: Payer: Self-pay | Admitting: Primary Care

## 2015-11-21 ENCOUNTER — Encounter: Payer: Self-pay | Admitting: Primary Care

## 2015-12-01 ENCOUNTER — Other Ambulatory Visit: Payer: Self-pay | Admitting: Primary Care

## 2015-12-01 DIAGNOSIS — F411 Generalized anxiety disorder: Secondary | ICD-10-CM

## 2015-12-01 NOTE — Telephone Encounter (Signed)
Ok to refill? Electronically refill request for   hydrOXYzine (ATARAX/VISTARIL) 25 MG tablet  Last prescribed and seen on 11/05/2015. No future appointment.

## 2015-12-04 ENCOUNTER — Encounter: Payer: Self-pay | Admitting: Primary Care

## 2015-12-06 DIAGNOSIS — Z803 Family history of malignant neoplasm of breast: Secondary | ICD-10-CM | POA: Diagnosis not present

## 2015-12-06 DIAGNOSIS — Z1231 Encounter for screening mammogram for malignant neoplasm of breast: Secondary | ICD-10-CM | POA: Diagnosis not present

## 2016-09-29 ENCOUNTER — Encounter: Payer: Self-pay | Admitting: Primary Care

## 2016-10-24 ENCOUNTER — Other Ambulatory Visit: Payer: Self-pay | Admitting: Primary Care

## 2016-10-24 DIAGNOSIS — F411 Generalized anxiety disorder: Secondary | ICD-10-CM

## 2016-12-08 ENCOUNTER — Ambulatory Visit (INDEPENDENT_AMBULATORY_CARE_PROVIDER_SITE_OTHER): Payer: BLUE CROSS/BLUE SHIELD | Admitting: Primary Care

## 2016-12-08 VITALS — BP 114/76 | HR 108 | Temp 98.0°F | Wt 148.0 lb

## 2016-12-08 DIAGNOSIS — J069 Acute upper respiratory infection, unspecified: Secondary | ICD-10-CM | POA: Diagnosis not present

## 2016-12-08 MED ORDER — FLUTICASONE PROPIONATE 50 MCG/ACT NA SUSP: 1.0000 | Freq: Two times a day (BID) | NASAL | 0 refills | Status: DC

## 2016-12-08 MED ORDER — BENZONATATE 200 MG PO CAPS: 200.0000 mg | ORAL_CAPSULE | Freq: Three times a day (TID) | ORAL | 0 refills | Status: DC | PRN

## 2016-12-08 NOTE — Patient Instructions (Signed)
Your symptoms are representative of a viral illness which will resolve on its own over time. Our goal is to treat your symptoms in order to aid your body in the healing process and to make you more comfortable.   You may take Benzonatate capsules for cough. Take 1 capsule by mouth three times daily as needed for cough.  Nasal Congestion/Ear Pressure: Try using Flonase (fluticasone) nasal spray. Instill 1 spray in each nostril twice daily.   Please notify me if you develop persistent fevers of 101, start coughing up green mucous, notice increased fatigue or weakness, or feel worse after 1 week of onset of symptoms.   Increase consumption of water intake and rest.  It was a pleasure to see you today!   Upper Respiratory Infection, Adult Most upper respiratory infections (URIs) are a viral infection of the air passages leading to the lungs. A URI affects the nose, throat, and upper air passages. The most common type of URI is nasopharyngitis and is typically referred to as "the common cold." URIs run their course and usually go away on their own. Most of the time, a URI does not require medical attention, but sometimes a bacterial infection in the upper airways can follow a viral infection. This is called a secondary infection. Sinus and middle ear infections are common types of secondary upper respiratory infections. Bacterial pneumonia can also complicate a URI. A URI can worsen asthma and chronic obstructive pulmonary disease (COPD). Sometimes, these complications can require emergency medical care and may be life threatening. What are the causes? Almost all URIs are caused by viruses. A virus is a type of germ and can spread from one person to another. What increases the risk? You may be at risk for a URI if:  You smoke.  You have chronic heart or lung disease.  You have a weakened defense (immune) system.  You are very young or very old.  You have nasal allergies or asthma.  You  work in crowded or poorly ventilated areas.  You work in health care facilities or schools.  What are the signs or symptoms? Symptoms typically develop 2-3 days after you come in contact with a cold virus. Most viral URIs last 7-10 days. However, viral URIs from the influenza virus (flu virus) can last 14-18 days and are typically more severe. Symptoms may include:  Runny or stuffy (congested) nose.  Sneezing.  Cough.  Sore throat.  Headache.  Fatigue.  Fever.  Loss of appetite.  Pain in your forehead, behind your eyes, and over your cheekbones (sinus pain).  Muscle aches.  How is this diagnosed? Your health care provider may diagnose a URI by:  Physical exam.  Tests to check that your symptoms are not due to another condition such as: ? Strep throat. ? Sinusitis. ? Pneumonia. ? Asthma.  How is this treated? A URI goes away on its own with time. It cannot be cured with medicines, but medicines may be prescribed or recommended to relieve symptoms. Medicines may help:  Reduce your fever.  Reduce your cough.  Relieve nasal congestion.  Follow these instructions at home:  Take medicines only as directed by your health care provider.  Gargle warm saltwater or take cough drops to comfort your throat as directed by your health care provider.  Use a warm mist humidifier or inhale steam from a shower to increase air moisture. This may make it easier to breathe.  Drink enough fluid to keep your urine clear or pale  yellow.  Eat soups and other clear broths and maintain good nutrition.  Rest as needed.  Return to work when your temperature has returned to normal or as your health care provider advises. You may need to stay home longer to avoid infecting others. You can also use a face mask and careful hand washing to prevent spread of the virus.  Increase the usage of your inhaler if you have asthma.  Do not use any tobacco products, including cigarettes, chewing  tobacco, or electronic cigarettes. If you need help quitting, ask your health care provider. How is this prevented? The best way to protect yourself from getting a cold is to practice good hygiene.  Avoid oral or hand contact with people with cold symptoms.  Wash your hands often if contact occurs.  There is no clear evidence that vitamin C, vitamin E, echinacea, or exercise reduces the chance of developing a cold. However, it is always recommended to get plenty of rest, exercise, and practice good nutrition. Contact a health care provider if:  You are getting worse rather than better.  Your symptoms are not controlled by medicine.  You have chills.  You have worsening shortness of breath.  You have brown or red mucus.  You have yellow or brown nasal discharge.  You have pain in your face, especially when you bend forward.  You have a fever.  You have swollen neck glands.  You have pain while swallowing.  You have white areas in the back of your throat. Get help right away if:  You have severe or persistent: ? Headache. ? Ear pain. ? Sinus pain. ? Chest pain.  You have chronic lung disease and any of the following: ? Wheezing. ? Prolonged cough. ? Coughing up blood. ? A change in your usual mucus.  You have a stiff neck.  You have changes in your: ? Vision. ? Hearing. ? Thinking. ? Mood. This information is not intended to replace advice given to you by your health care provider. Make sure you discuss any questions you have with your health care provider. Document Released: 07/28/2000 Document Revised: 10/05/2015 Document Reviewed: 05/09/2013 Elsevier Interactive Patient Education  2017 [employer].

## 2016-12-08 NOTE — Progress Notes (Signed)
Subjective:    Patient ID: Amanda Ho, female    DOB: January 24, 1971, 46 y.o.   MRN: [id number]  HPI  Amanda Ho is a 46 year old female who presents today with a chief complaint of cough. She also reports nasal congestion, sore throat ear fullness. Her symptoms began 2 days. She's been taking Mucinex, Robitussin DM and tylenol with temporary improvement. She was visiting in the hospital 4 days ago for a few hours. She denies fevers, productive cough, sinus pressure.   Review of Systems  Constitutional: Positive for fatigue. Negative for fever.  HENT: Positive for congestion, ear pain and sore throat.   Respiratory: Positive for cough.        Past Medical History:  Diagnosis Date  . Anemia   . Anxiety   . Asthma    rarely with URIs  . Family history of anesthesia complication    Vertigo postop  . Frequent headaches   . GERD (gastroesophageal reflux disease)      Social History   Social History  . Marital status: Married    Spouse name: N/A  . Number of children: N/A  . Years of education: N/A   Occupational History  . Not on file.   Social History Main Topics  . Smoking status: Never Smoker  . Smokeless tobacco: Not on file  . Alcohol use 0.6 oz/week    1 Glasses of wine per week     Comment: socially  . Drug use: No  . Sexual activity: Not on file   Other Topics Concern  . Not on file   Social History Narrative   Married.   2 children. 4 grandchildren.   Works as a Herbalist.    Enjoys reading, going to the farmers market, relaxing.    Past Surgical History:  Procedure Laterality Date  . ABDOMINAL HYSTERECTOMY  2013  . CESAREAN SECTION     x2   . LAPAROSCOPIC ASSISTED VAGINAL HYSTERECTOMY  01/12/2012   Procedure: LAPAROSCOPIC ASSISTED VAGINAL HYSTERECTOMY;  Surgeon: Luz Lex, MD;  Location: [facility];  Service: Gynecology;  Laterality: N/A;  . TUBAL LIGATION      Family History  Problem Relation Age of Onset  . Arthritis Mother     . Breast cancer Mother   . Mental illness Mother   . Mental illness Father   . Hypertension Brother   . Breast cancer Maternal Grandmother   . Heart disease Maternal Grandmother   . Hypertension Maternal Grandmother   . Alcohol abuse Maternal Grandfather     Allergies  Allergen Reactions  . Augmentin [Amoxicillin-Pot Clavulanate] Nausea And Vomiting  . Doxycycline Nausea And Vomiting  . Erythromycin Nausea And Vomiting  . Etodolac Other (See Comments)    Dizziness  . Flagyl [Metronidazole] Nausea And Vomiting    Both oral and cream   . Hydrocodone-Acetaminophen Nausea And Vomiting  . Ibuprofen Nausea And Vomiting  . Morphine And Related Other (See Comments)    Sleeps hard, can't wake up  . Prednisone Nausea And Vomiting  . Sulfa Antibiotics Nausea And Vomiting  . Talwin [Pentazocine] Nausea And Vomiting  . Sudafed [Pseudoephedrine Hcl] Palpitations    Tachycardia    Current Outpatient Prescriptions on File Prior to Visit  Medication Sig Dispense Refill  . FLUoxetine (PROZAC) 20 MG capsule Take 1 capsule (20 mg total) by mouth daily. NEED OFFICE VISIT FOR ANY MORE REFILLS 90 capsule 0  . hydrOXYzine (ATARAX/VISTARIL) 25 MG tablet TAKE 1 TABLET BY MOUTH  AT BEDTIME AS NEEDED FOR ANXIETY/SLEEP. 90 tablet 1  . omeprazole-sodium bicarbonate (ZEGERID) 40-1100 MG per capsule Take 1 capsule by mouth daily before breakfast.    . ondansetron (ZOFRAN-ODT) 4 MG disintegrating tablet Take 4 mg by mouth every 6 (six) hours as needed. For nausea     No current facility-administered medications on file prior to visit.     BP 114/76   Pulse (!) 108   Temp 98 F (36.7 C) (Oral)   Wt 148 lb (67.1 kg)   LMP 12/22/2011   SpO2 98%   BMI 25.40 kg/m    Objective:   Physical Exam  Constitutional: She appears well-nourished. She does not appear ill.  HENT:  Right Ear: Ear canal normal. Tympanic membrane is bulging. Tympanic membrane is not erythematous.  Left Ear: Ear canal normal.  Tympanic membrane is bulging. Tympanic membrane is not erythematous.  Nose: Right sinus exhibits no maxillary sinus tenderness and no frontal sinus tenderness. Left sinus exhibits no maxillary sinus tenderness and no frontal sinus tenderness.  Mouth/Throat: Oropharynx is clear and moist.  Eyes: Conjunctivae are normal.  Neck: Neck supple.  Cardiovascular: Normal rate and regular rhythm.   Pulmonary/Chest: Effort normal and breath sounds normal. She has no wheezes. She has no rales.  Lymphadenopathy:    She has no cervical adenopathy.  Skin: Skin is warm and dry.          Assessment & Plan:  URI:  Cough, congestion, fatigue x 2 days. Exam today without evidence of influenza or bacterial involvement. Suspect viral URI and will treat with conservative measures. Rx for Flonase and [employer] Fluids, rest, return precautions provided.  Sheral Flow, NP

## 2016-12-31 ENCOUNTER — Other Ambulatory Visit: Payer: Self-pay | Admitting: Primary Care

## 2016-12-31 DIAGNOSIS — J069 Acute upper respiratory infection, unspecified: Secondary | ICD-10-CM

## 2016-12-31 MED ORDER — FLUTICASONE PROPIONATE 50 MCG/ACT NA SUSP: 1.0000 | Freq: Two times a day (BID) | NASAL | 0 refills | Status: DC

## 2017-01-19 ENCOUNTER — Other Ambulatory Visit: Payer: Self-pay | Admitting: Primary Care

## 2017-01-19 DIAGNOSIS — F411 Generalized anxiety disorder: Secondary | ICD-10-CM

## 2017-01-19 MED ORDER — FLUOXETINE HCL 20 MG PO CAPS: 20.0000 mg | ORAL_CAPSULE | Freq: Every day | ORAL | 0 refills | Status: DC

## 2017-01-19 NOTE — Telephone Encounter (Signed)
Copied from [place] [id number]. Topic: Quick Communication - See Telephone Encounter >> Jan 19, 2017  9:52 AM Ether Griffins B wrote: CRM for notification. See Telephone encounter for:  Pt needing a refill on fluoxetine. Pt is scheduled for cpe on Monday 01/24/17 but depending on the weather pt might not be able to make the appt.  01/19/17.

## 2017-01-19 NOTE — Telephone Encounter (Signed)
Pt established care 11/05/15 and has appt for CPX on 01/24/17. Refilled per protocol and pt voiced understanding. [facility].

## 2017-01-24 ENCOUNTER — Encounter: Payer: Self-pay | Admitting: Primary Care

## 2017-02-21 ENCOUNTER — Encounter: Payer: Self-pay | Admitting: Primary Care

## 2017-02-21 ENCOUNTER — Ambulatory Visit (INDEPENDENT_AMBULATORY_CARE_PROVIDER_SITE_OTHER): Payer: BLUE CROSS/BLUE SHIELD | Admitting: Primary Care

## 2017-02-21 VITALS — BP 108/72 | HR 78 | Temp 99.2°F | Ht 64.0 in | Wt 151.8 lb

## 2017-02-21 DIAGNOSIS — F411 Generalized anxiety disorder: Secondary | ICD-10-CM

## 2017-02-21 DIAGNOSIS — K219 Gastro-esophageal reflux disease without esophagitis: Secondary | ICD-10-CM

## 2017-02-21 DIAGNOSIS — Z0001 Encounter for general adult medical examination with abnormal findings: Secondary | ICD-10-CM | POA: Insufficient documentation

## 2017-02-21 DIAGNOSIS — J019 Acute sinusitis, unspecified: Secondary | ICD-10-CM | POA: Diagnosis not present

## 2017-02-21 DIAGNOSIS — Z0000 Encounter for general adult medical examination without abnormal findings: Secondary | ICD-10-CM | POA: Diagnosis not present

## 2017-02-21 DIAGNOSIS — Z1231 Encounter for screening mammogram for malignant neoplasm of breast: Secondary | ICD-10-CM | POA: Diagnosis not present

## 2017-02-21 DIAGNOSIS — Z1239 Encounter for other screening for malignant neoplasm of breast: Secondary | ICD-10-CM

## 2017-02-21 MED ORDER — ONDANSETRON 4 MG PO TBDP: 4.0000 mg | ORAL_TABLET | Freq: Four times a day (QID) | ORAL | 0 refills | Status: DC | PRN

## 2017-02-21 MED ORDER — FLUOXETINE HCL 20 MG PO CAPS: 20.0000 mg | ORAL_CAPSULE | Freq: Every day | ORAL | 3 refills | Status: DC

## 2017-02-21 MED ORDER — BENZONATATE 200 MG PO CAPS: 200.0000 mg | ORAL_CAPSULE | Freq: Three times a day (TID) | ORAL | 0 refills | Status: DC | PRN

## 2017-02-21 MED ORDER — AMOXICILLIN 875 MG PO TABS: 875.0000 mg | ORAL_TABLET | Freq: Two times a day (BID) | ORAL | 0 refills | Status: DC

## 2017-02-21 NOTE — Assessment & Plan Note (Signed)
Tdap UTD, declines influenza vaccination. Mammogram due, orders placed. Discussed the importance of a healthy diet and regular exercise in order for weight loss, and to reduce the risk of any potential medical problems. Exam with acute sinusitis, see notes, otherwise unremarkable. Labs pending as she is not fasting today. Follow up in 1 year.

## 2017-02-21 NOTE — Assessment & Plan Note (Addendum)
Doing well on fluoxetine 20 mg, continue same. Denies SI/HI. Using Zofran very sparingly for nausea secondary to anxiety, refill sent to pharmacy.

## 2017-02-21 NOTE — Progress Notes (Signed)
Subjective:    Patient ID: Amanda Ho, female    DOB: 15-Jul-1970, 47 y.o.   MRN: [id number]  HPI  Amanda Ho is a 47 year old female who presents today for complete physical, she also presents today with a chief complaint of cough.  1) Cough: Also with sinus pressure, nasal congestion, cough. Her cough is productive with green/yellow mucous. Her symptoms began about 10 days ago. Overall her cough has progressed and her sinus pressure is worse. She's been taking Mucinex DM without much improvement.   Immunizations: -Tetanus: Completed in 2014 -Influenza: Declines   Diet: She endorses a fair diet. Breakfast: Oatmeal with nuts and seeds Lunch: Salads, eggs Dinner: Meat, vegetables, starch Snacks: Yogurt, protein bar Desserts: Chocolate, frozen yogurt (daily). Beverages: Coffee  Exercise: She is not currently exercising Eye exam: Completed in 2018 Dental exam: Completes semi-annually  Pap Smear: Hysterectomy  Mammogram: Due.     Review of Systems  Constitutional: Negative for fever and unexpected weight change.  HENT: Positive for congestion, ear pain and sinus pressure. Negative for sore throat.   Respiratory: Positive for cough. Negative for shortness of breath.   Cardiovascular: Negative for chest pain.  Gastrointestinal: Negative for constipation and diarrhea.  Genitourinary: Negative for difficulty urinating and menstrual problem.  Musculoskeletal: Negative for arthralgias and myalgias.  Skin: Negative for rash.  Allergic/Immunologic: Negative for environmental allergies.  Neurological: Negative for dizziness, numbness and headaches.  Psychiatric/Behavioral: Negative for suicidal ideas.       Doing well on Fluoxetine        Past Medical History:  Diagnosis Date  . Anemia   . Anxiety   . Asthma    rarely with URIs  . Family history of anesthesia complication    Vertigo postop  . Frequent headaches   . GERD (gastroesophageal reflux disease)        Social History   Socioeconomic History  . Marital status: Married    Spouse name: Not on file  . Number of children: Not on file  . Years of education: Not on file  . Highest education level: Not on file  Social Needs  . Financial resource strain: Not on file  . Food insecurity - worry: Not on file  . Food insecurity - inability: Not on file  . Transportation needs - medical: Not on file  . Transportation needs - non-medical: Not on file  Occupational History  . Not on file  Tobacco Use  . Smoking status: Never Smoker  . Smokeless tobacco: Never Used  Substance and Sexual Activity  . Alcohol use: Yes    Alcohol/week: 0.6 oz    Types: 1 Glasses of wine per week    Comment: socially  . Drug use: No  . Sexual activity: Not on file  Other Topics Concern  . Not on file  Social History Narrative   Married.   2 children. 4 grandchildren.   Works as a Herbalist.    Enjoys reading, going to the farmers market, relaxing.    Past Surgical History:  Procedure Laterality Date  . ABDOMINAL HYSTERECTOMY  2013  . CESAREAN SECTION     x2   . LAPAROSCOPIC ASSISTED VAGINAL HYSTERECTOMY  01/12/2012   Procedure: LAPAROSCOPIC ASSISTED VAGINAL HYSTERECTOMY;  Surgeon: Luz Lex, MD;  Location: [facility];  Service: Gynecology;  Laterality: N/A;  . TUBAL LIGATION      Family History  Problem Relation Age of Onset  . Arthritis Mother   . Breast cancer  Mother   . Mental illness Mother   . Mental illness Father   . Hypertension Brother   . Breast cancer Maternal Grandmother   . Heart disease Maternal Grandmother   . Hypertension Maternal Grandmother   . Alcohol abuse Maternal Grandfather     Allergies  Allergen Reactions  . Augmentin [Amoxicillin-Pot Clavulanate] Nausea And Vomiting  . Doxycycline Nausea And Vomiting  . Erythromycin Nausea And Vomiting  . Etodolac Other (See Comments)    Dizziness  . Flagyl [Metronidazole] Nausea And Vomiting    Both oral and cream   .  Hydrocodone-Acetaminophen Nausea And Vomiting  . Ibuprofen Nausea And Vomiting  . Morphine And Related Other (See Comments)    Sleeps hard, can't wake up  . Prednisone Nausea And Vomiting  . Sulfa Antibiotics Nausea And Vomiting  . Talwin [Pentazocine] Nausea And Vomiting  . Sudafed [Pseudoephedrine Hcl] Palpitations    Tachycardia    Current Outpatient Medications on File Prior to Visit  Medication Sig Dispense Refill  . omeprazole-sodium bicarbonate (ZEGERID) 40-1100 MG per capsule Take 1 capsule by mouth daily before breakfast.     No current facility-administered medications on file prior to visit.     BP 108/72   Pulse 78   Temp 99.2 F (37.3 C) (Oral)   Ht 5\' 4"  (1.626 m)   Wt 151 lb 12.8 oz (68.9 kg)   LMP 12/22/2011   SpO2 98%   BMI 26.06 kg/m    Objective:   Physical Exam  Constitutional: She is oriented to person, place, and time. She appears well-nourished. She appears ill.  HENT:  Right Ear: Tympanic membrane and ear canal normal.  Left Ear: Tympanic membrane and ear canal normal.  Nose: Mucosal edema present. Right sinus exhibits maxillary sinus tenderness. Left sinus exhibits maxillary sinus tenderness.  Mouth/Throat: Oropharynx is clear and moist.  Eyes: Conjunctivae and EOM are normal. Pupils are equal, round, and reactive to light.  Neck: Neck supple. No thyromegaly present.  Cardiovascular: Normal rate and regular rhythm.  No murmur heard. Pulmonary/Chest: Effort normal. She has no wheezes. She has no rales.  Deep bronchospasm type cough noted on exam  Abdominal: Soft. Bowel sounds are normal. There is no tenderness.  Musculoskeletal: Normal range of motion.  Lymphadenopathy:    She has no cervical adenopathy.  Neurological: She is alert and oriented to person, place, and time. She has normal reflexes. No cranial nerve deficit.  Skin: Skin is warm and dry. No rash noted.  Psychiatric: She has a normal mood and affect.          Assessment &  Plan:  Acute Sinusitis:  Sinus pressure, cough, congestion x 10 day, now feeling worse. Exam today consistent for acute sinusitis. Given duration of symptoms coupled with presentation, will treat for likely bacterial involvement.  Rx for Amoxil course and benzonatate capsules sent to pharmacy. Fluids, rest, follow up PRN.  Sheral Flow, NP

## 2017-02-21 NOTE — Patient Instructions (Addendum)
Preventive Care 40-64 Years, Female Preventive care refers to lifestyle choices and visits with your health care provider that can promote health and wellness. What does preventive care include?  A yearly physical exam. This is also called an annual well check.  Dental exams once or twice a year.  Routine eye exams. Ask your health care provider how often you should have your eyes checked.  Personal lifestyle choices, including: ? Daily care of your teeth and gums. ? Regular physical activity. ? Eating a healthy diet. ? Avoiding tobacco and drug use. ? Limiting alcohol use. ? Practicing safe sex. ? Taking low-dose aspirin daily starting at age 58. ? Taking vitamin and mineral supplements as recommended by your health care provider. What happens during an annual well check? The services and screenings done by your health care provider during your annual well check will depend on your age, overall health, lifestyle risk factors, and family history of disease. Counseling Your health care provider may ask you questions about your:  Alcohol use.  Tobacco use.  Drug use.  Emotional well-being.  Home and relationship well-being.  Sexual activity.  Eating habits.  Work and work Statistician.  Method of birth control.  Menstrual cycle.  Pregnancy history.  Screening You may have the following tests or measurements:  Height, weight, and BMI.  Blood pressure.  Lipid and cholesterol levels. These may be checked every 5 years, or more frequently if you are over 81 years old.  Skin check.  Lung cancer screening. You may have this screening every year starting at age 78 if you have a 30-pack-year history of smoking and currently smoke or have quit within the past 15 years.  Fecal occult blood test (FOBT) of the stool. You may have this test every year starting at age 65.  Flexible sigmoidoscopy or colonoscopy. You may have a sigmoidoscopy every 5 years or a colonoscopy  every 10 years starting at age 30.  Hepatitis C blood test.  Hepatitis B blood test.  Sexually transmitted disease (STD) testing.  Diabetes screening. This is done by checking your blood sugar (glucose) after you have not eaten for a while (fasting). You may have this done every 1-3 years.  Mammogram. This may be done every 1-2 years. Talk to your health care provider about when you should start having regular mammograms. This may depend on whether you have a family history of breast cancer.  BRCA-related cancer screening. This may be done if you have a family history of breast, ovarian, tubal, or peritoneal cancers.  Pelvic exam and Pap test. This may be done every 3 years starting at age 80. Starting at age 36, this may be done every 5 years if you have a Pap test in combination with an HPV test.  Bone density scan. This is done to screen for osteoporosis. You may have this scan if you are at high risk for osteoporosis.  Discuss your test results, treatment options, and if necessary, the need for more tests with your health care provider. Vaccines Your health care provider may recommend certain vaccines, such as:  Influenza vaccine. This is recommended every year.  Tetanus, diphtheria, and acellular pertussis (Tdap, Td) vaccine. You may need a Td booster every 10 years.  Varicella vaccine. You may need this if you have not been vaccinated.  Zoster vaccine. You may need this after age 5.  Measles, mumps, and rubella (MMR) vaccine. You may need at least one dose of MMR if you were born in  1957 or later. You may also need a second dose.  Pneumococcal 13-valent conjugate (PCV13) vaccine. You may need this if you have certain conditions and were not previously vaccinated.  Pneumococcal polysaccharide (PPSV23) vaccine. You may need one or two doses if you smoke cigarettes or if you have certain conditions.  Meningococcal vaccine. You may need this if you have certain  conditions.  Hepatitis A vaccine. You may need this if you have certain conditions or if you travel or work in places where you may be exposed to hepatitis A.  Hepatitis B vaccine. You may need this if you have certain conditions or if you travel or work in places where you may be exposed to hepatitis B.  Haemophilus influenzae type b (Hib) vaccine. You may need this if you have certain conditions.  Talk to your health care provider about which screenings and vaccines you need and how often you need them. This information is not intended to replace advice given to you by your health care provider. Make sure you discuss any questions you have with your health care provider. Document Released: 02/28/2015 Document Revised: 10/22/2015 Document Reviewed: 12/03/2014 Elsevier Interactive Patient Education  2018 [place] to schedule your mammogram.  Start exercising. You should be getting 150 minutes of moderate intensity exercise weekly.  Increase consumption of vegetables, fruit, whole grains.  Ensure you are consuming 64 ounces of water daily.  Start amoxicillin antibiotics for the infection. Take 1 tablet by mouth twice daily for 10 days.  You may take Benzonatate capsules for cough. Take 1 capsule by mouth three times daily as needed for cough.  Schedule a lab only appointment to return fasting for your labs. No food 4 hours prior to labs. You may have water and black coffee only.  Follow up in 1 year for your annual exam or sooner if needed.  It was a pleasure to see you today!

## 2017-02-21 NOTE — Assessment & Plan Note (Signed)
Doing well on Zegerid, continue same.

## 2017-03-04 ENCOUNTER — Other Ambulatory Visit (INDEPENDENT_AMBULATORY_CARE_PROVIDER_SITE_OTHER): Payer: BLUE CROSS/BLUE SHIELD

## 2017-03-04 DIAGNOSIS — Z0000 Encounter for general adult medical examination without abnormal findings: Secondary | ICD-10-CM

## 2017-03-04 LAB — COMPREHENSIVE METABOLIC PANEL
ALBUMIN: 3.9 g/dL (ref 3.5–5.2)
ALT: 10 U/L (ref 0–35)
AST: 18 U/L (ref 0–37)
Alkaline Phosphatase: 61 U/L (ref 39–117)
BILIRUBIN TOTAL: 0.4 mg/dL (ref 0.2–1.2)
BUN: 12 mg/dL (ref 6–23)
CO2: 32 mEq/L (ref 19–32)
Calcium: 9 mg/dL (ref 8.4–10.5)
Chloride: 100 mEq/L (ref 96–112)
Creatinine, Ser: 0.74 mg/dL (ref 0.40–1.20)
GFR: 90.13 mL/min (ref >60.00)
Glucose, Bld: 87 mg/dL (ref 70–99)
Potassium: 4 mEq/L (ref 3.5–5.1)
Sodium: 137 mEq/L (ref 135–145)
Total Protein: 6.8 g/dL (ref 6.0–8.3)

## 2017-03-04 LAB — LIPID PANEL
Cholesterol: 138 mg/dL (ref 0–200)
HDL: 46.8 mg/dL (ref >39.00)
LDL Cholesterol: 80 mg/dL (ref 0–99)
NonHDL: 91.66
Total CHOL/HDL Ratio: 3
Triglycerides: 57 mg/dL (ref 0.0–149.0)
VLDL: 11.4 mg/dL (ref 0.0–40.0)

## 2018-03-28 DIAGNOSIS — Z6825 Body mass index (BMI) 25.0-25.9, adult: Secondary | ICD-10-CM | POA: Diagnosis not present

## 2018-03-28 DIAGNOSIS — Z803 Family history of malignant neoplasm of breast: Secondary | ICD-10-CM | POA: Diagnosis not present

## 2018-03-28 DIAGNOSIS — Z1231 Encounter for screening mammogram for malignant neoplasm of breast: Secondary | ICD-10-CM | POA: Diagnosis not present

## 2018-03-28 DIAGNOSIS — N951 Menopausal and female climacteric states: Secondary | ICD-10-CM | POA: Diagnosis not present

## 2018-03-28 DIAGNOSIS — Z01419 Encounter for gynecological examination (general) (routine) without abnormal findings: Secondary | ICD-10-CM | POA: Diagnosis not present

## 2018-04-24 ENCOUNTER — Other Ambulatory Visit: Payer: Self-pay | Admitting: Primary Care

## 2018-04-24 DIAGNOSIS — F411 Generalized anxiety disorder: Secondary | ICD-10-CM

## 2018-05-09 DIAGNOSIS — Z809 Family history of malignant neoplasm, unspecified: Secondary | ICD-10-CM | POA: Diagnosis not present

## 2018-05-19 ENCOUNTER — Other Ambulatory Visit: Payer: Self-pay | Admitting: Primary Care

## 2018-05-19 DIAGNOSIS — F411 Generalized anxiety disorder: Secondary | ICD-10-CM

## 2018-05-26 ENCOUNTER — Other Ambulatory Visit: Payer: Self-pay | Admitting: Primary Care

## 2018-05-26 DIAGNOSIS — F411 Generalized anxiety disorder: Secondary | ICD-10-CM

## 2018-05-30 NOTE — Telephone Encounter (Signed)
Needs virtual visit. Thanks!~

## 2018-05-30 NOTE — Telephone Encounter (Signed)
Will you please abstract mammogram and pap smear? Thanks.

## 2018-05-31 ENCOUNTER — Ambulatory Visit (INDEPENDENT_AMBULATORY_CARE_PROVIDER_SITE_OTHER): Payer: BLUE CROSS/BLUE SHIELD | Admitting: Primary Care

## 2018-05-31 ENCOUNTER — Encounter: Payer: Self-pay | Admitting: Primary Care

## 2018-05-31 DIAGNOSIS — F411 Generalized anxiety disorder: Secondary | ICD-10-CM | POA: Diagnosis not present

## 2018-05-31 DIAGNOSIS — K219 Gastro-esophageal reflux disease without esophagitis: Secondary | ICD-10-CM

## 2018-05-31 MED ORDER — FLUOXETINE HCL 20 MG PO CAPS: 20.0000 mg | ORAL_CAPSULE | Freq: Every day | ORAL | 3 refills | Status: DC

## 2018-05-31 NOTE — Patient Instructions (Signed)
Continue fluoxetine 20 mg daily for anxiety. I just sent refills of your fluoxetine to your pharmacy.  It was a pleasure to see you today! Allie Bossier, NP-C

## 2018-05-31 NOTE — Assessment & Plan Note (Signed)
Doing well on Zegrid, continue same.

## 2018-05-31 NOTE — Progress Notes (Signed)
Subjective:    Patient ID: Amanda Ho, female    DOB: 14-Jul-1970, 48 y.o.   MRN: [id number]  HPI  Virtual Visit via Video Note  I connected with Amanda Ho on 05/31/18 at  9:00 AM EDT by a video enabled telemedicine application and verified that I am speaking with the correct person using two identifiers.   I discussed the limitations of evaluation and management by telemedicine and the availability of in person appointments. The patient expressed understanding and agreed to proceed. She is at home, I am in the office.  History of Present Illness:  Amanda Ho is a 48 year old female who presents today for follow up and medication refill.  1) GERD: Currently managed on omeprazole-sodium bicarbonate 40-1100 mg. She is taking this daily with improvement, denies breakthrough heartburn.   2) GAD: Currently managed on fluoxetine 20 mg daily. She is doing well overall on the fluoxetine 20 mg. She denies SI/HI.     Observations/Objective:  Alert and oriented. Speaking in complete sentences. No distress. Appears well.  Assessment and Plan:  See problem based charting.  Follow Up Instructions:  Continue fluoxetine 20 mg daily for anxiety. I just sent refills of your fluoxetine to your pharmacy.  It was a pleasure to see you today! Allie Bossier, NP-C    I discussed the assessment and treatment plan with the patient. The patient was provided an opportunity to ask questions and all were answered. The patient agreed with the plan and demonstrated an understanding of the instructions.   The patient was advised to call back or seek an in-person evaluation if the symptoms worsen or if the condition fails to improve as anticipated.     Pleas Koch, NP    Review of Systems  Respiratory: Negative for shortness of breath.   Cardiovascular: Negative for chest pain.  Neurological: Negative for dizziness and headaches.       Past Medical History:  Diagnosis  Date  . Anemia   . Anxiety   . Asthma    rarely with URIs  . Family history of anesthesia complication    Vertigo postop  . Frequent headaches   . GERD (gastroesophageal reflux disease)      Social History   Socioeconomic History  . Marital status: Married    Spouse name: Not on file  . Number of children: Not on file  . Years of education: Not on file  . Highest education level: Not on file  Occupational History  . Not on file  Social Needs  . Financial resource strain: Not on file  . Food insecurity:    Worry: Not on file    Inability: Not on file  . Transportation needs:    Medical: Not on file    Non-medical: Not on file  Tobacco Use  . Smoking status: Never Smoker  . Smokeless tobacco: Never Used  Substance and Sexual Activity  . Alcohol use: Yes    Alcohol/week: 1.0 standard drinks    Types: 1 Glasses of wine per week    Comment: socially  . Drug use: No  . Sexual activity: Not on file  Lifestyle  . Physical activity:    Days per week: Not on file    Minutes per session: Not on file  . Stress: Not on file  Relationships  . Social connections:    Talks on phone: Not on file    Gets together: Not on file    Attends religious service: Not  on file    Active member of club or organization: Not on file    Attends meetings of clubs or organizations: Not on file    Relationship status: Not on file  . Intimate partner violence:    Fear of current or ex partner: Not on file    Emotionally abused: Not on file    Physically abused: Not on file    Forced sexual activity: Not on file  Other Topics Concern  . Not on file  Social History Narrative   Married.   2 children. 4 grandchildren.   Works as a Herbalist.    Enjoys reading, going to the farmers market, relaxing.    Past Surgical History:  Procedure Laterality Date  . ABDOMINAL HYSTERECTOMY  2013  . CESAREAN SECTION     x2   . LAPAROSCOPIC ASSISTED VAGINAL HYSTERECTOMY  01/12/2012   Procedure:  LAPAROSCOPIC ASSISTED VAGINAL HYSTERECTOMY;  Surgeon: Luz Lex, MD;  Location: [facility];  Service: Gynecology;  Laterality: N/A;  . TUBAL LIGATION      Family History  Problem Relation Age of Onset  . Arthritis Mother   . Breast cancer Mother   . Mental illness Mother   . Mental illness Father   . Stroke Father   . Hypertension Brother   . Breast cancer Maternal Grandmother   . Heart disease Maternal Grandmother   . Hypertension Maternal Grandmother   . Alcohol abuse Maternal Grandfather   . Parkinson's disease Paternal Aunt     Allergies  Allergen Reactions  . Augmentin [Amoxicillin-Pot Clavulanate] Nausea And Vomiting  . Doxycycline Nausea And Vomiting  . Erythromycin Nausea And Vomiting  . Etodolac Other (See Comments)    Dizziness  . Flagyl [Metronidazole] Nausea And Vomiting    Both oral and cream   . Hydrocodone-Acetaminophen Nausea And Vomiting  . Ibuprofen Nausea And Vomiting  . Morphine And Related Other (See Comments)    Sleeps hard, can't wake up  . Prednisone Nausea And Vomiting  . Sulfa Antibiotics Nausea And Vomiting  . Talwin [Pentazocine] Nausea And Vomiting  . Sudafed [Pseudoephedrine Hcl] Palpitations    Tachycardia    Current Outpatient Medications on File Prior to Visit  Medication Sig Dispense Refill  . FLUoxetine (PROZAC) 20 MG capsule Take 1 capsule (20 mg total) by mouth daily. NEED APPOINTMENT FOR ANY MORE REFILLS 30 capsule 0  . omeprazole-sodium bicarbonate (ZEGERID) 40-1100 MG per capsule Take 1 capsule by mouth daily before breakfast.    . ondansetron (ZOFRAN-ODT) 4 MG disintegrating tablet Take 1 tablet (4 mg total) by mouth every 6 (six) hours as needed. For nausea (Patient not taking: Reported on 05/31/2018) 15 tablet 0   No current facility-administered medications on file prior to visit.     LMP 12/22/2011    Objective:   Physical Exam  Constitutional: She is oriented to person, place, and time. She appears well-nourished.   Respiratory: Effort normal.  Neurological: She is alert and oriented to person, place, and time.  Psychiatric: She has a normal mood and affect.           Assessment & Plan:

## 2018-05-31 NOTE — Assessment & Plan Note (Signed)
Doing well on fluoxetine 20 mg, denies SI/HI. Continue same. Refills sent to pharmacy.

## 2018-08-15 ENCOUNTER — Telehealth: Payer: Self-pay | Admitting: *Deleted

## 2018-08-15 ENCOUNTER — Other Ambulatory Visit: Payer: BLUE CROSS/BLUE SHIELD

## 2018-08-15 DIAGNOSIS — Z20822 Contact with and (suspected) exposure to covid-19: Secondary | ICD-10-CM

## 2018-08-15 DIAGNOSIS — R6889 Other general symptoms and signs: Secondary | ICD-10-CM | POA: Diagnosis not present

## 2018-08-15 NOTE — Telephone Encounter (Signed)
-----   Message from Pleas Koch, NP sent at 08/14/2018  4:54 PM EDT ----- Regarding: Covid Testing Good afternoon!  This patient was exposed to Covid at work. No one in her office wears masks, she has no symptoms. Please consider her for testing.  Thanks! Allie Bossier, NP-C

## 2018-08-15 NOTE — Telephone Encounter (Signed)
Scheduled patient for COVID 19 test today at 10:15 am at [facility].  Testing protocol reviewed.

## 2018-08-17 LAB — NOVEL CORONAVIRUS, NAA: SARS-CoV-2, NAA: NOT DETECTED

## 2019-02-24 ENCOUNTER — Encounter (HOSPITAL_COMMUNITY): Payer: Self-pay

## 2019-02-24 ENCOUNTER — Emergency Department (HOSPITAL_COMMUNITY)
Admission: EM | Admit: 2019-02-24 | Discharge: 2019-02-24 | Disposition: A | Discharge status: Home / Self Care | Payer: BC Managed Care – PPO | Attending: Emergency Medicine | Admitting: Emergency Medicine

## 2019-02-24 ENCOUNTER — Other Ambulatory Visit: Payer: Self-pay

## 2019-02-24 ENCOUNTER — Emergency Department (HOSPITAL_COMMUNITY): Payer: BC Managed Care – PPO

## 2019-02-24 DIAGNOSIS — R112 Nausea with vomiting, unspecified: Secondary | ICD-10-CM | POA: Diagnosis not present

## 2019-02-24 DIAGNOSIS — R55 Syncope and collapse: Secondary | ICD-10-CM | POA: Diagnosis not present

## 2019-02-24 DIAGNOSIS — R109 Unspecified abdominal pain: Secondary | ICD-10-CM | POA: Diagnosis not present

## 2019-02-24 DIAGNOSIS — R000 Tachycardia, unspecified: Secondary | ICD-10-CM | POA: Diagnosis not present

## 2019-02-24 DIAGNOSIS — J45909 Unspecified asthma, uncomplicated: Secondary | ICD-10-CM | POA: Insufficient documentation

## 2019-02-24 DIAGNOSIS — Z79899 Other long term (current) drug therapy: Secondary | ICD-10-CM | POA: Insufficient documentation

## 2019-02-24 DIAGNOSIS — R1084 Generalized abdominal pain: Secondary | ICD-10-CM | POA: Diagnosis not present

## 2019-02-24 DIAGNOSIS — K529 Noninfective gastroenteritis and colitis, unspecified: Secondary | ICD-10-CM | POA: Diagnosis not present

## 2019-02-24 LAB — URINALYSIS, ROUTINE W REFLEX MICROSCOPIC
Bilirubin Urine: NEGATIVE
Glucose, UA: NEGATIVE mg/dL
Hgb urine dipstick: NEGATIVE
Ketones, ur: NEGATIVE mg/dL
Leukocytes,Ua: NEGATIVE
Nitrite: NEGATIVE
Protein, ur: NEGATIVE mg/dL
Specific Gravity, Urine: 1.015 (ref 1.005–1.030)
pH: 7 (ref 5.0–8.0)

## 2019-02-24 LAB — CBC WITH DIFFERENTIAL/PLATELET
Abs Immature Granulocytes: 0.13 10*3/uL — ABNORMAL HIGH (ref 0.00–0.07)
Basophils Absolute: 0 10*3/uL (ref 0.0–0.1)
Basophils Relative: 0 %
Eosinophils Absolute: 0 10*3/uL (ref 0.0–0.5)
Eosinophils Relative: 0 %
HCT: 42.2 % (ref 36.0–46.0)
Hemoglobin: 13.3 g/dL (ref 12.0–15.0)
Immature Granulocytes: 1 %
Lymphocytes Relative: 3 %
Lymphs Abs: 0.5 10*3/uL — ABNORMAL LOW (ref 0.7–4.0)
MCH: 31.3 pg (ref 26.0–34.0)
MCHC: 31.5 g/dL (ref 30.0–36.0)
MCV: 99.3 fL (ref 80.0–100.0)
Monocytes Absolute: 0.5 10*3/uL (ref 0.1–1.0)
Monocytes Relative: 3 %
Neutro Abs: 18 10*3/uL — ABNORMAL HIGH (ref 1.7–7.7)
Neutrophils Relative %: 93 %
Platelets: 267 10*3/uL (ref 150–400)
RBC: 4.25 MIL/uL (ref 3.87–5.11)
RDW: 13.2 % (ref 11.5–15.5)
WBC: 19.1 10*3/uL — ABNORMAL HIGH (ref 4.0–10.5)
nRBC: 0 % (ref 0.0–0.2)

## 2019-02-24 LAB — COMPREHENSIVE METABOLIC PANEL
ALT: 18 U/L (ref 0–44)
AST: 20 U/L (ref 15–41)
Albumin: 4 g/dL (ref 3.5–5.0)
Alkaline Phosphatase: 52 U/L (ref 38–126)
Anion gap: 8 (ref 5–15)
BUN: 13 mg/dL (ref 6–20)
CO2: 26 mmol/L (ref 22–32)
Calcium: 8.6 mg/dL — ABNORMAL LOW (ref 8.9–10.3)
Chloride: 105 mmol/L (ref 98–111)
Creatinine, Ser: 0.69 mg/dL (ref 0.44–1.00)
GFR calc Af Amer: >60 mL/min (ref >60)
GFR calc non Af Amer: >60 mL/min (ref >60)
Glucose, Bld: 108 mg/dL — ABNORMAL HIGH (ref 70–99)
Potassium: 3.9 mmol/L (ref 3.5–5.1)
Sodium: 139 mmol/L (ref 135–145)
Total Bilirubin: 0.7 mg/dL (ref 0.3–1.2)
Total Protein: 7.1 g/dL (ref 6.5–8.1)

## 2019-02-24 MED ORDER — AMOXICILLIN-POT CLAVULANATE 875-125 MG PO TABS: 1.0000 | ORAL_TABLET | Freq: Two times a day (BID) | ORAL | 0 refills | Status: DC

## 2019-02-24 NOTE — Discharge Instructions (Addendum)
It is important for you to take the Augmentin as directed. Please complete the entire course of antibiotics regardless of symptom improvement to prevent worsening or recurrence of your infection. Return to the ED if you start to have worsening symptoms, develop a fever, worsening pain, inability to tolerate anything by mouth, blood in your stool.

## 2019-02-24 NOTE — ED Triage Notes (Signed)
Pt arrives via [facility] from home with complaints of left sided flank pain, nausea, dysuria and polyuria. EMS gave 1064ml NS, 4mg  IV Zofran.

## 2019-02-24 NOTE — ED Provider Notes (Signed)
[facility] DEPT Provider Note   CSN: [id number] Arrival date & time: 02/24/19  [id number]     History Chief Complaint  Patient presents with  . Flank Pain    Amanda Ho is a 49 y.o. female with a past medical history of anxiety, GERD, anemia presenting to the ED with a chief complaint of abdominal pain.  Pain is in her lower abdomen radiating to her left flank and left back.  Has been having cramping abdominal pain for the past few days.  Symptoms became sharp and constant approximately 5 hours prior to arrival with associated nausea, vomiting, urinary frequency.  She initially thought it was due to diverticulitis but after doing by EMS was concerned that she had a kidney stone.  Denies history of kidney stones or diverticulosis in the past.  She has been having normal bowel movements "but it feels like I need to have diarrhea."  She has not taken any medications to help with her symptoms.  Denies any fever, vaginal complaints, sick contacts with similar symptoms, shortness of breath, chest pain.  Prior abdominal surgeries include C-section x2.  She reports occasional alcohol use but denies any tobacco or drug use.  HPI     Past Medical History:  Diagnosis Date  . Anemia   . Anxiety   . Asthma    rarely with URIs  . Family history of anesthesia complication    Vertigo postop  . Frequent headaches   . GERD (gastroesophageal reflux disease)     Patient Active Problem List   Diagnosis Date Noted  . Preventative health care 02/21/2017  . GAD (generalized anxiety disorder) 11/05/2015  . GERD (gastroesophageal reflux disease) 11/05/2015    Past Surgical History:  Procedure Laterality Date  . ABDOMINAL HYSTERECTOMY  2013  . CESAREAN SECTION     x2   . LAPAROSCOPIC ASSISTED VAGINAL HYSTERECTOMY  01/12/2012   Procedure: LAPAROSCOPIC ASSISTED VAGINAL HYSTERECTOMY;  Surgeon: Luz Lex, MD;  Location: [facility];  Service: Gynecology;  Laterality: N/A;    . TUBAL LIGATION       OB History   No obstetric history on file.     Family History  Problem Relation Age of Onset  . Arthritis Mother   . Breast cancer Mother   . Mental illness Mother   . Mental illness Father   . Stroke Father   . Hypertension Brother   . Breast cancer Maternal Grandmother   . Heart disease Maternal Grandmother   . Hypertension Maternal Grandmother   . Alcohol abuse Maternal Grandfather   . Parkinson's disease Paternal Aunt     Social History   Tobacco Use  . Smoking status: Never Smoker  . Smokeless tobacco: Never Used  Substance Use Topics  . Alcohol use: Yes    Alcohol/week: 1.0 standard drinks    Types: 1 Glasses of wine per week    Comment: socially  . Drug use: No    Home Medications Prior to Admission medications   Medication Sig Start Date End Date Taking? Authorizing Provider  acetaminophen (TYLENOL) 325 MG tablet Take 650 mg by mouth every 6 (six) hours as needed for mild pain or headache.   Yes [provider]  diphenhydramine-acetaminophen (TYLENOL PM) 25-500 MG TABS tablet Take 0.5 tablets by mouth at bedtime as needed (pain/sleep).   Yes [provider]  FLUoxetine (PROZAC) 20 MG capsule Take 1 capsule (20 mg total) by mouth daily. For anxiety. 05/31/18  Yes Pleas Koch,  NP  omeprazole-sodium bicarbonate (ZEGERID) 40-1100 MG per capsule Take 1 capsule by mouth daily before breakfast.   Yes [provider]  OVER THE COUNTER MEDICATION Take 1 packet by mouth daily. Emergence C packet   Yes [provider]  amoxicillin-clavulanate (AUGMENTIN) 875-125 MG tablet Take 1 tablet by mouth every 12 (twelve) hours. 02/24/19   Tashanna Dolin, PA-C  ondansetron (ZOFRAN-ODT) 4 MG disintegrating tablet Take 1 tablet (4 mg total) by mouth every 6 (six) hours as needed. For nausea Patient not taking: Reported on 05/31/2018 02/21/17   Pleas Koch, NP    Allergies    Augmentin [amoxicillin-pot clavulanate],  Doxycycline, Erythromycin, Etodolac, Flagyl [metronidazole], Hydrocodone-acetaminophen, Ibuprofen, Morphine and related, Prednisone, Sulfa antibiotics, Talwin [pentazocine], and Sudafed [pseudoephedrine hcl]  Review of Systems   Review of Systems  Constitutional: Negative for appetite change, chills and fever.  HENT: Negative for ear pain, rhinorrhea, sneezing and sore throat.   Eyes: Negative for photophobia and visual disturbance.  Respiratory: Negative for cough, chest tightness, shortness of breath and wheezing.   Cardiovascular: Negative for chest pain and palpitations.  Gastrointestinal: Positive for abdominal pain, nausea and vomiting. Negative for blood in stool, constipation and diarrhea.  Genitourinary: Positive for frequency. Negative for dysuria, hematuria and urgency.  Musculoskeletal: Negative for myalgias.  Skin: Negative for rash.  Neurological: Negative for dizziness, weakness and light-headedness.    Physical Exam Updated Vital Signs BP 114/73 (BP Location: Right Arm)   Pulse 93   Temp 98.1 F (36.7 C) (Oral)   Resp 16   Ht 5\' 3"  (1.6 m)   Wt 65.8 kg   LMP 12/22/2011   SpO2 100%   BMI 25.69 kg/m   Physical Exam Vitals and nursing note reviewed.  Constitutional:      General: She is not in acute distress.    Appearance: She is well-developed.  HENT:     Head: Normocephalic and atraumatic.     Nose: Nose normal.  Eyes:     General: No scleral icterus.       Left eye: No discharge.     Conjunctiva/sclera: Conjunctivae normal.  Cardiovascular:     Rate and Rhythm: Normal rate and regular rhythm.     Heart sounds: Normal heart sounds. No murmur. No friction rub. No gallop.   Pulmonary:     Effort: Pulmonary effort is normal. No respiratory distress.     Breath sounds: Normal breath sounds.  Abdominal:     General: Bowel sounds are normal. There is no distension.     Palpations: Abdomen is soft.     Tenderness: There is abdominal tenderness (Lower  abdomen). There is left CVA tenderness. There is no guarding.  Musculoskeletal:        General: Normal range of motion.     Cervical back: Normal range of motion and neck supple.  Skin:    General: Skin is warm and dry.     Findings: No rash.  Neurological:     Mental Status: She is alert.     Motor: No abnormal muscle tone.     Coordination: Coordination normal.     ED Results / Procedures / Treatments   Labs (all labs ordered are listed, but only abnormal results are displayed) Labs Reviewed  COMPREHENSIVE METABOLIC PANEL - Abnormal; Notable for the following components:      Result Value   Glucose, Bld 108 (*)    Calcium 8.6 (*)    All other components within normal limits  CBC WITH DIFFERENTIAL/PLATELET - Abnormal; Notable for the following components:   WBC 19.1 (*)    Neutro Abs 18.0 (*)    Lymphs Abs 0.5 (*)    Abs Immature Granulocytes 0.13 (*)    All other components within normal limits  URINALYSIS, ROUTINE W REFLEX MICROSCOPIC - Abnormal; Notable for the following components:   Color, Urine AMBER (*)    APPearance CLOUDY (*)    All other components within normal limits  URINE CULTURE    EKG None  Radiology CT Renal Stone Study  Result Date: 02/24/2019 CLINICAL DATA:  Left-sided flank pain with dysuria and nausea EXAM: CT ABDOMEN AND PELVIS WITHOUT CONTRAST TECHNIQUE: Multidetector CT imaging of the abdomen and pelvis was performed following the standard protocol without oral or IV contrast. COMPARISON:  None. FINDINGS: Lower chest: Lung bases are clear. Hepatobiliary: No focal liver lesions are appreciable on this noncontrast enhanced study. Gallbladder wall is not appreciably thickened. There is no biliary duct dilatation. Pancreas: There is no pancreatic mass or inflammatory focus. Spleen: No splenic lesions are evident. Adrenals/Urinary Tract: Adrenals bilaterally appear normal. Kidneys bilaterally show no evident mass or hydronephrosis on either side. There is  no appreciable renal or ureteral calculus on either side. Urinary bladder is midline with wall thickness within normal limits. Stomach/Bowel: There is diffuse stool throughout colon. There is wall thickening throughout much of the colon. No small bowel wall thickening is noted. There is no evident bowel obstruction. The terminal ileum appears normal. There is no evident free air or portal venous air. Vascular/Lymphatic: There is no abdominal aortic aneurysm. No vascular lesions are evident this noncontrast enhanced study. There is no appreciable adenopathy in the abdomen or pelvis. Reproductive: Uterus is absent.  There is no evident pelvic mass. Other: Appendix region appears unremarkable. There is no abscess or ascites in the abdomen or pelvis. There is slight fat in the umbilicus. Musculoskeletal: No blastic or lytic bone lesions. No intramuscular lesions are evident. IMPRESSION: 1. Diffuse stool throughout colon. There is mild generalized colonic wall thickening, a finding indicative of a degree of colitis. No small bowel wall thickening noted. No evident bowel obstruction. 2. No evident renal or ureteral calculus. No hydronephrosis on either side. Urinary bladder wall thickness is within normal limits. 3. No abscess in the abdomen pelvis. Appendix region appears normal. 4.  Uterus absent. Electronically Signed   By: Lowella Grip III M.D.   On: 02/24/2019 10:31    Procedures Procedures (including critical care time)  Medications Ordered in ED Medications - No data to display  ED Course  I have reviewed the triage vital signs and the nursing notes.  Pertinent labs & imaging results that were available during my care of the patient were reviewed by me and considered in my medical decision making (see chart for details).    MDM Rules/Calculators/A&P                      49 year old female with past medical history of anxiety, GERD, anemia presenting to the ED with a chief complaint of  abdominal pain.  Reports lower abdominal pain radiating to her left flank and left back.  Pain has been constant since this morning.  Reports normal bowel movement but "it feels like I need to have diarrhea."  On exam patient is tenderness palpation of the lower abdomen and left flank without rebound or guarding.  Left CVA tenderness as well.  Vital signs are reassuring, she is afebrile  with no recent use of antipyretics.  Work-up here significant for leukocytosis of 19, urinalysis without signs of infection or hematuria.  CMP unremarkable.  CT shows findings consistent with colitis.  Patient able to tolerate p.o. intake without difficulty here.  I had a discussion with the patient regarding her medication intolerances.  I spoke to the pharmacist as well that feels that patient will get maximum benefit from either Cipro and Flagyl or Augmentin.  It appears that patient has had intolerance to Augmentin and Flagyl with nausea and vomiting with both but no true anaphylactic reaction reported.  States that she would rather be on just the Augmentin and will take her Zofran as needed as well.  Return precautions given.  Patient is hemodynamically stable, in NAD, and able to ambulate in the ED. Evaluation does not show pathology that would require ongoing emergent intervention or inpatient treatment. I explained the diagnosis to the patient. Pain has been managed and has no complaints prior to discharge. Patient is comfortable with above plan and is stable for discharge at this time. All questions were answered prior to disposition. Strict return precautions for returning to the ED were discussed. Encouraged follow up with PCP.   An After Visit Summary was printed and given to the patient.   Portions of this note were generated with [employer]. Dictation errors may occur despite best attempts at proofreading.  Final Clinical Impression(s) / ED Diagnoses Final diagnoses:  Colitis    Rx / DC  Orders ED Discharge Orders         Ordered    amoxicillin-clavulanate (AUGMENTIN) 875-125 MG tablet  Every 12 hours     02/24/19 1144           Delia Heady, PA-C 02/24/19 1144    Lacretia Leigh, MD 02/25/19 1437

## 2019-02-25 LAB — URINE CULTURE: Culture: NO GROWTH

## 2019-05-13 ENCOUNTER — Other Ambulatory Visit: Payer: Self-pay | Admitting: Primary Care

## 2019-05-13 DIAGNOSIS — F411 Generalized anxiety disorder: Secondary | ICD-10-CM

## 2019-06-01 DIAGNOSIS — Z6825 Body mass index (BMI) 25.0-25.9, adult: Secondary | ICD-10-CM | POA: Diagnosis not present

## 2019-06-01 DIAGNOSIS — K529 Noninfective gastroenteritis and colitis, unspecified: Secondary | ICD-10-CM

## 2019-06-01 DIAGNOSIS — Z1231 Encounter for screening mammogram for malignant neoplasm of breast: Secondary | ICD-10-CM | POA: Diagnosis not present

## 2019-06-01 DIAGNOSIS — Z01419 Encounter for gynecological examination (general) (routine) without abnormal findings: Secondary | ICD-10-CM | POA: Diagnosis not present

## 2019-06-01 DIAGNOSIS — R3915 Urgency of urination: Secondary | ICD-10-CM | POA: Diagnosis not present

## 2019-06-01 HISTORY — DX: Noninfective gastroenteritis and colitis, unspecified: K52.9

## 2019-06-04 LAB — HM MAMMOGRAPHY

## 2019-08-07 ENCOUNTER — Other Ambulatory Visit: Payer: Self-pay | Admitting: Primary Care

## 2019-08-07 DIAGNOSIS — F411 Generalized anxiety disorder: Secondary | ICD-10-CM

## 2019-08-29 ENCOUNTER — Other Ambulatory Visit: Payer: Self-pay | Admitting: Primary Care

## 2019-08-29 DIAGNOSIS — F411 Generalized anxiety disorder: Secondary | ICD-10-CM

## 2019-09-24 ENCOUNTER — Other Ambulatory Visit: Payer: Self-pay | Admitting: Primary Care

## 2019-09-24 DIAGNOSIS — F411 Generalized anxiety disorder: Secondary | ICD-10-CM

## 2019-09-24 MED ORDER — FLUOXETINE HCL 20 MG PO CAPS: 20.0000 mg | ORAL_CAPSULE | Freq: Every day | ORAL | 0 refills | Status: DC

## 2019-09-26 ENCOUNTER — Other Ambulatory Visit: Payer: Self-pay | Admitting: Internal Medicine

## 2019-09-26 DIAGNOSIS — Z0000 Encounter for general adult medical examination without abnormal findings: Secondary | ICD-10-CM

## 2019-10-05 ENCOUNTER — Other Ambulatory Visit (INDEPENDENT_AMBULATORY_CARE_PROVIDER_SITE_OTHER): Payer: BC Managed Care – PPO

## 2019-10-05 ENCOUNTER — Other Ambulatory Visit: Payer: Self-pay

## 2019-10-05 DIAGNOSIS — Z0000 Encounter for general adult medical examination without abnormal findings: Secondary | ICD-10-CM

## 2019-10-05 LAB — COMPREHENSIVE METABOLIC PANEL
ALT: 16 U/L (ref 0–35)
AST: 19 U/L (ref 0–37)
Albumin: 4 g/dL (ref 3.5–5.2)
Alkaline Phosphatase: 59 U/L (ref 39–117)
BUN: 11 mg/dL (ref 6–23)
CO2: 30 mEq/L (ref 19–32)
Calcium: 9.1 mg/dL (ref 8.4–10.5)
Chloride: 103 mEq/L (ref 96–112)
Creatinine, Ser: 0.8 mg/dL (ref 0.40–1.20)
GFR: 76.63 mL/min (ref >60.00)
Glucose, Bld: 94 mg/dL (ref 70–99)
Potassium: 3.8 mEq/L (ref 3.5–5.1)
Sodium: 140 mEq/L (ref 135–145)
Total Bilirubin: 0.4 mg/dL (ref 0.2–1.2)
Total Protein: 7 g/dL (ref 6.0–8.3)

## 2019-10-05 LAB — LIPID PANEL
Cholesterol: 157 mg/dL (ref 0–200)
HDL: 55.5 mg/dL (ref >39.00)
LDL Cholesterol: 89 mg/dL (ref 0–99)
NonHDL: 101.33
Total CHOL/HDL Ratio: 3
Triglycerides: 63 mg/dL (ref 0.0–149.0)
VLDL: 12.6 mg/dL (ref 0.0–40.0)

## 2019-10-05 LAB — CBC
HCT: 38.6 % (ref 36.0–46.0)
Hemoglobin: 12.9 g/dL (ref 12.0–15.0)
MCHC: 33.4 g/dL (ref 30.0–36.0)
MCV: 93.9 fl (ref 78.0–100.0)
Platelets: 217 10*3/uL (ref 150.0–400.0)
RBC: 4.11 Mil/uL (ref 3.87–5.11)
RDW: 13.5 % (ref 11.5–15.5)
WBC: 4.9 10*3/uL (ref 4.0–10.5)

## 2019-10-05 LAB — VITAMIN D 25 HYDROXY (VIT D DEFICIENCY, FRACTURES): VITD: 26.93 ng/mL — ABNORMAL LOW (ref 30.00–100.00)

## 2019-10-10 ENCOUNTER — Other Ambulatory Visit: Payer: BC Managed Care – PPO

## 2019-10-12 ENCOUNTER — Encounter: Payer: Self-pay | Admitting: Primary Care

## 2019-10-12 ENCOUNTER — Ambulatory Visit (INDEPENDENT_AMBULATORY_CARE_PROVIDER_SITE_OTHER): Payer: BC Managed Care – PPO | Admitting: Primary Care

## 2019-10-12 ENCOUNTER — Other Ambulatory Visit: Payer: Self-pay

## 2019-10-12 VITALS — BP 104/62 | HR 86 | Ht 64.0 in | Wt 153.0 lb

## 2019-10-12 DIAGNOSIS — E559 Vitamin D deficiency, unspecified: Secondary | ICD-10-CM | POA: Insufficient documentation

## 2019-10-12 DIAGNOSIS — F411 Generalized anxiety disorder: Secondary | ICD-10-CM | POA: Diagnosis not present

## 2019-10-12 DIAGNOSIS — M79605 Pain in left leg: Secondary | ICD-10-CM

## 2019-10-12 DIAGNOSIS — F3342 Major depressive disorder, recurrent, in full remission: Secondary | ICD-10-CM

## 2019-10-12 DIAGNOSIS — M79606 Pain in leg, unspecified: Secondary | ICD-10-CM

## 2019-10-12 DIAGNOSIS — M79604 Pain in right leg: Secondary | ICD-10-CM

## 2019-10-12 DIAGNOSIS — Z0000 Encounter for general adult medical examination without abnormal findings: Secondary | ICD-10-CM | POA: Diagnosis not present

## 2019-10-12 DIAGNOSIS — K219 Gastro-esophageal reflux disease without esophagitis: Secondary | ICD-10-CM | POA: Diagnosis not present

## 2019-10-12 DIAGNOSIS — N3281 Overactive bladder: Secondary | ICD-10-CM | POA: Insufficient documentation

## 2019-10-12 HISTORY — DX: Pain in leg, unspecified: M79.606

## 2019-10-12 MED ORDER — GABAPENTIN 100 MG PO CAPS: ORAL_CAPSULE | ORAL | 0 refills | Status: DC

## 2019-10-12 NOTE — Assessment & Plan Note (Signed)
Doing well on Zegerid, continue same. Cannot go without.

## 2019-10-12 NOTE — Patient Instructions (Signed)
Start exercising. You should be getting 150 minutes of moderate intensity exercise weekly.  It's important to improve your diet by reducing consumption of fast food, fried food, processed snack foods, sugary drinks. Increase consumption of fresh vegetables and fruits, whole grains, water.  Ensure you are drinking 64 ounces of water daily.  Start gabapentin 100 mg for restless and painful legs. Take 1-3 capsules by mouth at bedtime. This may cause drowsiness.   Please update me in 1-2 weeks.  It was a pleasure to see you today!   Preventive Care 60-76 Years Old, Female Preventive care refers to visits with your health care provider and lifestyle choices that can promote health and wellness. This includes:  A yearly physical exam. This may also be called an annual well check.  Regular dental visits and eye exams.  Immunizations.  Screening for certain conditions.  Healthy lifestyle choices, such as eating a healthy diet, getting regular exercise, not using drugs or products that contain nicotine and tobacco, and limiting alcohol use. What can I expect for my preventive care visit? Physical exam Your health care provider will check your:  Height and weight. This may be used to calculate body mass index (BMI), which tells if you are at a healthy weight.  Heart rate and blood pressure.  Skin for abnormal spots. Counseling Your health care provider may ask you questions about your:  Alcohol, tobacco, and drug use.  Emotional well-being.  Home and relationship well-being.  Sexual activity.  Eating habits.  Work and work Statistician.  Method of birth control.  Menstrual cycle.  Pregnancy history. What immunizations do I need?  Influenza (flu) vaccine  This is recommended every year. Tetanus, diphtheria, and pertussis (Tdap) vaccine  You may need a Td booster every 10 years. Varicella (chickenpox) vaccine  You may need this if you have not been  vaccinated. Zoster (shingles) vaccine  You may need this after age 11. Measles, mumps, and rubella (MMR) vaccine  You may need at least one dose of MMR if you were born in 1957 or later. You may also need a second dose. Pneumococcal conjugate (PCV13) vaccine  You may need this if you have certain conditions and were not previously vaccinated. Pneumococcal polysaccharide (PPSV23) vaccine  You may need one or two doses if you smoke cigarettes or if you have certain conditions. Meningococcal conjugate (MenACWY) vaccine  You may need this if you have certain conditions. Hepatitis A vaccine  You may need this if you have certain conditions or if you travel or work in places where you may be exposed to hepatitis A. Hepatitis B vaccine  You may need this if you have certain conditions or if you travel or work in places where you may be exposed to hepatitis B. Haemophilus influenzae type b (Hib) vaccine  You may need this if you have certain conditions. Human papillomavirus (HPV) vaccine  If recommended by your health care provider, you may need three doses over 6 months. You may receive vaccines as individual doses or as more than one vaccine together in one shot (combination vaccines). Talk with your health care provider about the risks and benefits of combination vaccines. What tests do I need? Blood tests  Lipid and cholesterol levels. These may be checked every 5 years, or more frequently if you are over 67 years old.  Hepatitis C test.  Hepatitis B test. Screening  Lung cancer screening. You may have this screening every year starting at age 24 if you have  a 30-pack-year history of smoking and currently smoke or have quit within the past 15 years.  Colorectal cancer screening. All adults should have this screening starting at age 39 and continuing until age 44. Your health care provider may recommend screening at age 40 if you are at increased risk. You will have tests every  1-10 years, depending on your results and the type of screening test.  Diabetes screening. This is done by checking your blood sugar (glucose) after you have not eaten for a while (fasting). You may have this done every 1-3 years.  Mammogram. This may be done every 1-2 years. Talk with your health care provider about when you should start having regular mammograms. This may depend on whether you have a family history of breast cancer.  BRCA-related cancer screening. This may be done if you have a family history of breast, ovarian, tubal, or peritoneal cancers.  Pelvic exam and Pap test. This may be done every 3 years starting at age 80. Starting at age 12, this may be done every 5 years if you have a Pap test in combination with an HPV test. Other tests  Sexually transmitted disease (STD) testing.  Bone density scan. This is done to screen for osteoporosis. You may have this scan if you are at high risk for osteoporosis. Follow these instructions at home: Eating and drinking  Eat a diet that includes fresh fruits and vegetables, whole grains, lean protein, and low-fat dairy.  Take vitamin and mineral supplements as recommended by your health care provider.  Do not drink alcohol if: ? Your health care provider tells you not to drink. ? You are pregnant, may be pregnant, or are planning to become pregnant.  If you drink alcohol: ? Limit how much you have to 0-1 drink a day. ? Be aware of how much alcohol is in your drink. In the [place] one drink equals one 12 oz bottle of beer (355 mL), one 5 oz glass of wine (148 mL), or one 1 oz glass of hard liquor (44 mL). Lifestyle  Take daily care of your teeth and gums.  Stay active. Exercise for at least 30 minutes on 5 or more days each week.  Do not use any products that contain nicotine or tobacco, such as cigarettes, e-cigarettes, and chewing tobacco. If you need help quitting, ask your health care provider.  If you are sexually active,  practice safe sex. Use a condom or other form of birth control (contraception) in order to prevent pregnancy and STIs (sexually transmitted infections).  If told by your health care provider, take low-dose aspirin daily starting at age 17. What's next?  Visit your health care provider once a year for a well check visit.  Ask your health care provider how often you should have your eyes and teeth checked.  Stay up to date on all vaccines. This information is not intended to replace advice given to you by your health care provider. Make sure you discuss any questions you have with your health care provider. Document Revised: 10/13/2017 Document Reviewed: 10/13/2017 Elsevier Patient Education  2020 [employer].

## 2019-10-12 NOTE — Assessment & Plan Note (Signed)
Immunizations UTD. Mammogram UTD, follows with GYN. Colonoscopy due, she kindly declines.   Discussed the importance of a healthy diet and regular exercise in order for weight loss, and to reduce the risk of any potential medical problems.  Exam today unremarkable. Labs reviewed.

## 2019-10-12 NOTE — Assessment & Plan Note (Signed)
Doing well on fluoxetine, continue same.

## 2019-10-12 NOTE — Assessment & Plan Note (Signed)
Overall doing well on fluoxetine 20 mg, continue same.

## 2019-10-12 NOTE — Assessment & Plan Note (Signed)
Chronic since childhood. Daily, worse at night, waxes and wanes. Also with "jumping" around at times. Improved when taking Clonazepam years ago. Never diagnosed with restless leg syndrome.   No weakness/numbness/tingling.   Rx for gabapentin provided today to use at bedtime.  She will update in a few weeks.

## 2019-10-12 NOTE — Progress Notes (Signed)
Subjective:    Patient ID: Amanda Ho, female    DOB: 24-May-1970, 49 y.o.   MRN: [id number]  HPI  This visit occurred during the SARS-CoV-2 public health emergency.  Safety protocols were in place, including screening questions prior to the visit, additional usage of staff PPE, and extensive cleaning of exam room while observing appropriate contact time as indicated for disinfecting solutions.   Amanda Ho is a 49 year old female who presents today for complete physical.  Immunizations: -Tetanus: Completed in 2014 -Influenza: Due this season  -Covid-19: Completed series  Diet: She endorses a fair diet. Exercise: No regular exercise  Eye exam: Completed in 2020 Dental exam: Completes annually   Pap Smear: Hysterectomy  Mammogram: Completed in 2021 Colonoscopy: Never completed, declines today Hep C Screen: Due  BP Readings from Last 3 Encounters:  10/12/19 104/62  02/24/19 (!) 110/57  02/21/17 108/72      Review of Systems  Constitutional: Negative for unexpected weight change.  HENT: Negative for rhinorrhea.   Respiratory: Negative for cough and shortness of breath.   Cardiovascular: Negative for chest pain.  Gastrointestinal: Negative for constipation and diarrhea.  Genitourinary: Negative for difficulty urinating and menstrual problem.  Musculoskeletal: Positive for arthralgias and myalgias.       Chronic lower extremity pain since childhood. Daily, worse at night, waxes and wanes. Also with "jumping" around at times. Improved when taking Clonazepam years ago. Never diagnosed with restless leg syndrome.    Skin: Negative for rash.  Allergic/Immunologic: Negative for environmental allergies.  Neurological: Positive for headaches. Negative for dizziness.       Stress headaches  Psychiatric/Behavioral: Positive for sleep disturbance. The patient is not nervous/anxious.        Past Medical History:  Diagnosis Date  . Anemia   . Anxiety   . Asthma     rarely with URIs  . Family history of anesthesia complication    Vertigo postop  . Frequent headaches   . GERD (gastroesophageal reflux disease)      Social History   Socioeconomic History  . Marital status: Married    Spouse name: Not on file  . Number of children: Not on file  . Years of education: Not on file  . Highest education level: Not on file  Occupational History  . Not on file  Tobacco Use  . Smoking status: Never Smoker  . Smokeless tobacco: Never Used  Substance and Sexual Activity  . Alcohol use: Yes    Alcohol/week: 1.0 standard drink    Types: 1 Glasses of wine per week    Comment: socially  . Drug use: No  . Sexual activity: Not on file  Other Topics Concern  . Not on file  Social History Narrative   Married.   2 children. 4 grandchildren.   Works as a Herbalist.    Enjoys reading, going to the farmers market, relaxing.   Social Determinants of Health   Financial Resource Strain:   . Difficulty of Paying Living Expenses: Not on file  Food Insecurity:   . Worried About Charity fundraiser in the Last Year: Not on file  . Ran Out of Food in the Last Year: Not on file  Transportation Needs:   . Lack of Transportation (Medical): Not on file  . Lack of Transportation (Non-Medical): Not on file  Physical Activity:   . Days of Exercise per Week: Not on file  . Minutes of Exercise per Session: Not on file  Stress:   . Feeling of Stress : Not on file  Social Connections:   . Frequency of Communication with Friends and Family: Not on file  . Frequency of Social Gatherings with Friends and Family: Not on file  . Attends Religious Services: Not on file  . Active Member of Clubs or Organizations: Not on file  . Attends Archivist Meetings: Not on file  . Marital Status: Not on file  Intimate Partner Violence:   . Fear of Current or Ex-Partner: Not on file  . Emotionally Abused: Not on file  . Physically Abused: Not on file  . Sexually  Abused: Not on file    Past Surgical History:  Procedure Laterality Date  . ABDOMINAL HYSTERECTOMY  2013  . CESAREAN SECTION     x2   . LAPAROSCOPIC ASSISTED VAGINAL HYSTERECTOMY  01/12/2012   Procedure: LAPAROSCOPIC ASSISTED VAGINAL HYSTERECTOMY;  Surgeon: Luz Lex, MD;  Location: [facility];  Service: Gynecology;  Laterality: N/A;  . TUBAL LIGATION      Family History  Problem Relation Age of Onset  . Arthritis Mother   . Breast cancer Mother   . Mental illness Mother   . Mental illness Father   . Stroke Father   . Hypertension Brother   . Breast cancer Maternal Grandmother   . Heart disease Maternal Grandmother   . Hypertension Maternal Grandmother   . Alcohol abuse Maternal Grandfather   . Parkinson's disease Paternal Aunt     Allergies  Allergen Reactions  . Augmentin [Amoxicillin-Pot Clavulanate] Nausea And Vomiting  . Doxycycline Nausea And Vomiting  . Erythromycin Nausea And Vomiting  . Etodolac Other (See Comments)    Dizziness  . Flagyl [Metronidazole] Nausea And Vomiting    Both oral and cream   . Hydrocodone-Acetaminophen Nausea And Vomiting  . Ibuprofen Nausea And Vomiting  . Morphine And Related Other (See Comments)    Sleeps hard, can't wake up  . Prednisone Nausea And Vomiting  . Sulfa Antibiotics Nausea And Vomiting  . Talwin [Pentazocine] Nausea And Vomiting  . Sudafed [Pseudoephedrine Hcl] Palpitations    Tachycardia    Current Outpatient Medications on File Prior to Visit  Medication Sig Dispense Refill  . Mirabegron (MYRBETRIQ PO) Take by mouth as needed.    Marland Kitchen acetaminophen (TYLENOL) 325 MG tablet Take 650 mg by mouth every 6 (six) hours as needed for mild pain or headache.    . diphenhydramine-acetaminophen (TYLENOL PM) 25-500 MG TABS tablet Take 0.5 tablets by mouth at bedtime as needed (pain/sleep).    Marland Kitchen FLUoxetine (PROZAC) 20 MG capsule Take 1 capsule (20 mg total) by mouth daily. For anxiety. 30 capsule 0  . omeprazole-sodium bicarbonate  (ZEGERID) 40-1100 MG per capsule Take 1 capsule by mouth daily before breakfast.    . OVER THE COUNTER MEDICATION Take 1 packet by mouth daily. Emergence C packet     No current facility-administered medications on file prior to visit.    BP 104/62   Pulse 86   Ht 5\' 4"  (1.626 m)   Wt 153 lb (69.4 kg)   LMP 12/22/2011   SpO2 95%   BMI 26.26 kg/m    Objective:   Physical Exam HENT:     Right Ear: Tympanic membrane and ear canal normal.     Left Ear: Tympanic membrane and ear canal normal.  Eyes:     Pupils: Pupils are equal, round, and reactive to light.  Cardiovascular:     Rate and Rhythm:  Normal rate and regular rhythm.  Pulmonary:     Effort: Pulmonary effort is normal.     Breath sounds: Normal breath sounds.  Abdominal:     General: Bowel sounds are normal.     Palpations: Abdomen is soft.     Tenderness: There is no abdominal tenderness.  Musculoskeletal:        General: Normal range of motion.     Cervical back: Neck supple.  Skin:    General: Skin is warm and dry.  Neurological:     Mental Status: She is alert and oriented to person, place, and time.     Cranial Nerves: No cranial nerve deficit.     Deep Tendon Reflexes:     Reflex Scores:      Patellar reflexes are 2+ on the right side and 2+ on the left side. Psychiatric:        Mood and Affect: Mood normal.            Assessment & Plan:

## 2019-10-12 NOTE — Assessment & Plan Note (Signed)
Recent level low despite 1000 IU of vitamin D.  Add in an extra 1000 IU to make 2000 IU.

## 2019-10-12 NOTE — Assessment & Plan Note (Signed)
Currently on Myrbetriq per GYN. Using PRN. Continue same.

## 2019-10-16 ENCOUNTER — Other Ambulatory Visit: Payer: Self-pay | Admitting: Primary Care

## 2019-10-16 DIAGNOSIS — F411 Generalized anxiety disorder: Secondary | ICD-10-CM

## 2019-10-26 ENCOUNTER — Other Ambulatory Visit: Payer: BC Managed Care – PPO

## 2019-10-26 ENCOUNTER — Other Ambulatory Visit: Payer: Self-pay | Admitting: Critical Care Medicine

## 2019-10-26 DIAGNOSIS — Z20822 Contact with and (suspected) exposure to covid-19: Secondary | ICD-10-CM

## 2019-10-29 LAB — NOVEL CORONAVIRUS, NAA: SARS-CoV-2, NAA: NOT DETECTED

## 2019-11-07 ENCOUNTER — Other Ambulatory Visit: Payer: Self-pay | Admitting: Primary Care

## 2019-11-07 DIAGNOSIS — M79604 Pain in right leg: Secondary | ICD-10-CM

## 2019-11-07 DIAGNOSIS — M79605 Pain in left leg: Secondary | ICD-10-CM

## 2020-01-09 ENCOUNTER — Other Ambulatory Visit: Payer: Self-pay | Admitting: Primary Care

## 2020-01-09 DIAGNOSIS — F411 Generalized anxiety disorder: Secondary | ICD-10-CM

## 2020-01-09 NOTE — Telephone Encounter (Signed)
Pt left v/m that she just took home covid test that was +;pt wants to know what to do; pt has already pt messaged Gentry Fitz NP; pt had [place] covid vaccines on 06/30/19 and 07/21/19. I spoke with pt and on 01/07/20 pt began with scratchy throat, a lot of head congestion, when blows nose mucus is clear, chills last night but none today, slight body aches,non prod cough started today,pt has loss of appetite,today still scratchy throat and runny nose with slight nausea; no fever, no muscle pain and no severe H/A,SOB,CP, diarrhea, no loss of taste or smell, and no vomiting. Pt also said that her husband has no symptoms and was covid vaccinated.pts husband is going to stay in guest room at home; pt's husband should self quarantine for 5 days then get tested unless develops symptoms and then get covid tested sooner. Pt was advised to drink plenty of fluids,rest, tylenol for fever and body aches and self quarantine for 10 days from today. Pt request cb after Gentry Fitz NP reviews this note to see if she has further advice. UC & ED precautions given and pt voiced understanding.

## 2020-01-10 ENCOUNTER — Telehealth: Payer: Self-pay | Admitting: Infectious Diseases

## 2020-01-10 ENCOUNTER — Other Ambulatory Visit: Payer: Self-pay | Admitting: Infectious Diseases

## 2020-01-10 DIAGNOSIS — U071 COVID-19: Secondary | ICD-10-CM

## 2020-01-10 DIAGNOSIS — Z6826 Body mass index (BMI) 26.0-26.9, adult: Secondary | ICD-10-CM

## 2020-01-10 NOTE — Telephone Encounter (Signed)
Called to Discuss with patient about Covid symptoms and the use of the monoclonal antibody infusion for those with mild to moderate Covid symptoms and at a high risk of hospitalization.     Pt appears to qualify for this infusion due to co-morbid conditions and/or a member of an at-risk group in accordance with the FDA Emergency Use Authorization.    Referral received from PCP  +home test  Sx started on Monday 01/07/20 Qualifiers include BMI 26  Scheduled for Saturday AM    Janene Madeira, MSN, NP-C [facility] [email or web address] Pager: [phone] Office: [phone] [place]: [phone]

## 2020-01-10 NOTE — Progress Notes (Signed)
I connected by phone with Amanda Ho on 01/10/2020 at 12:17 PM to discuss the potential use of a new treatment for mild to moderate COVID-19 viral infection in non-hospitalized patients.  This patient is a 49 y.o. female that meets the FDA criteria for Emergency Use Authorization of COVID monoclonal antibody casirivimab/imdevimab, bamlanivimab/eteseviamb, or sotrovimab.  Has a (+) direct SARS-CoV-2 viral test result  Has mild or moderate COVID-19   Is NOT hospitalized due to COVID-19  Is within 10 days of symptom onset  Has at least one of the high risk factor(s) for progression to severe COVID-19 and/or hospitalization as defined in EUA.  Specific high risk criteria : BMI > 25   I have spoken and communicated the following to the patient or parent/caregiver regarding COVID monoclonal antibody treatment:  1. FDA has authorized the emergency use for the treatment of mild to moderate COVID-19 in adults and pediatric patients with positive results of direct SARS-CoV-2 viral testing who are 85 years of age and older weighing at least 40 kg, and who are at high risk for progressing to severe COVID-19 and/or hospitalization.  2. The significant known and potential risks and benefits of COVID monoclonal antibody, and the extent to which such potential risks and benefits are unknown.  3. Information on available alternative treatments and the risks and benefits of those alternatives, including clinical trials.  4. Patients treated with COVID monoclonal antibody should continue to self-isolate and use infection control measures (e.g., wear mask, isolate, social distance, avoid sharing personal items, clean and disinfect "high touch" surfaces, and frequent handwashing) according to CDC guidelines.   5. The patient or parent/caregiver has the option to accept or refuse COVID monoclonal antibody treatment.  After reviewing this information with the patient, the patient has agreed to receive  one of the available covid 19 monoclonal antibodies and will be provided an appropriate fact sheet prior to infusion. Amanda Madeira, NP 01/10/2020 12:17 PM

## 2020-01-12 ENCOUNTER — Ambulatory Visit (HOSPITAL_COMMUNITY): Payer: BC Managed Care – PPO

## 2020-07-03 ENCOUNTER — Other Ambulatory Visit: Payer: Self-pay | Admitting: Primary Care

## 2020-07-03 DIAGNOSIS — F411 Generalized anxiety disorder: Secondary | ICD-10-CM

## 2020-09-30 ENCOUNTER — Other Ambulatory Visit: Payer: Self-pay | Admitting: Primary Care

## 2020-09-30 DIAGNOSIS — F411 Generalized anxiety disorder: Secondary | ICD-10-CM

## 2020-10-02 NOTE — Telephone Encounter (Signed)
Patient due for CPE refill sent in for 30 day with message to make appointment.

## 2020-11-01 ENCOUNTER — Other Ambulatory Visit: Payer: Self-pay | Admitting: Primary Care

## 2020-11-01 DIAGNOSIS — F411 Generalized anxiety disorder: Secondary | ICD-10-CM

## 2020-12-04 ENCOUNTER — Ambulatory Visit (INDEPENDENT_AMBULATORY_CARE_PROVIDER_SITE_OTHER): Payer: BC Managed Care – PPO | Admitting: Primary Care

## 2020-12-04 ENCOUNTER — Other Ambulatory Visit: Payer: Self-pay | Admitting: Primary Care

## 2020-12-04 ENCOUNTER — Other Ambulatory Visit: Payer: Self-pay

## 2020-12-04 ENCOUNTER — Encounter: Payer: Self-pay | Admitting: Primary Care

## 2020-12-04 VITALS — BP 104/64 | HR 88 | Temp 97.8°F | Ht 64.0 in | Wt 153.0 lb

## 2020-12-04 DIAGNOSIS — Z1159 Encounter for screening for other viral diseases: Secondary | ICD-10-CM | POA: Diagnosis not present

## 2020-12-04 DIAGNOSIS — F411 Generalized anxiety disorder: Secondary | ICD-10-CM

## 2020-12-04 DIAGNOSIS — Z114 Encounter for screening for human immunodeficiency virus [HIV]: Secondary | ICD-10-CM

## 2020-12-04 DIAGNOSIS — Z0000 Encounter for general adult medical examination without abnormal findings: Secondary | ICD-10-CM

## 2020-12-04 DIAGNOSIS — E559 Vitamin D deficiency, unspecified: Secondary | ICD-10-CM

## 2020-12-04 DIAGNOSIS — N3281 Overactive bladder: Secondary | ICD-10-CM

## 2020-12-04 DIAGNOSIS — F3342 Major depressive disorder, recurrent, in full remission: Secondary | ICD-10-CM | POA: Diagnosis not present

## 2020-12-04 DIAGNOSIS — K219 Gastro-esophageal reflux disease without esophagitis: Secondary | ICD-10-CM

## 2020-12-04 LAB — COMPREHENSIVE METABOLIC PANEL
ALT: 14 U/L (ref 0–35)
AST: 22 U/L (ref 0–37)
Albumin: 4 g/dL (ref 3.5–5.2)
Alkaline Phosphatase: 57 U/L (ref 39–117)
BUN: 10 mg/dL (ref 6–23)
CO2: 31 mEq/L (ref 19–32)
Calcium: 9 mg/dL (ref 8.4–10.5)
Chloride: 102 mEq/L (ref 96–112)
Creatinine, Ser: 0.76 mg/dL (ref 0.40–1.20)
GFR: 92.32 mL/min (ref >60.00)
Glucose, Bld: 91 mg/dL (ref 70–99)
Potassium: 4 mEq/L (ref 3.5–5.1)
Sodium: 138 mEq/L (ref 135–145)
Total Bilirubin: 0.5 mg/dL (ref 0.2–1.2)
Total Protein: 6.9 g/dL (ref 6.0–8.3)

## 2020-12-04 LAB — LIPID PANEL
Cholesterol: 155 mg/dL (ref 0–200)
HDL: 53.3 mg/dL (ref >39.00)
LDL Cholesterol: 89 mg/dL (ref 0–99)
NonHDL: 101.22
Total CHOL/HDL Ratio: 3
Triglycerides: 59 mg/dL (ref 0.0–149.0)
VLDL: 11.8 mg/dL (ref 0.0–40.0)

## 2020-12-04 LAB — CBC
HCT: 40.2 % (ref 36.0–46.0)
Hemoglobin: 13.3 g/dL (ref 12.0–15.0)
MCHC: 33.1 g/dL (ref 30.0–36.0)
MCV: 94.9 fl (ref 78.0–100.0)
Platelets: 240 10*3/uL (ref 150.0–400.0)
RBC: 4.23 Mil/uL (ref 3.87–5.11)
RDW: 13.1 % (ref 11.5–15.5)
WBC: 4 10*3/uL (ref 4.0–10.5)

## 2020-12-04 NOTE — Assessment & Plan Note (Signed)
Doing well on Zegerid 40-1100 mg daily, continue same.

## 2020-12-04 NOTE — Assessment & Plan Note (Signed)
Declines influenza vaccine. Tetanus UTD. Mammogram due, she will complete through GYN. Colonoscopy due, she declines despite recommendations.  Discussed the importance of a healthy diet and regular exercise in order for weight loss, and to reduce the risk of further co-morbidity.  Exam today stable. Labs pending.

## 2020-12-04 NOTE — Assessment & Plan Note (Signed)
Doing well on fluoxetine 20 mg, continue same.

## 2020-12-04 NOTE — Patient Instructions (Signed)
Stop by the lab prior to leaving today. I will notify you of your results once received.   Complete your mammogram.  It was a pleasure to see you today!  Preventive Care 25-49 Years Old, Female Preventive care refers to lifestyle choices and visits with your health care provider that can promote health and wellness. This includes: A yearly physical exam. This is also called an annual wellness visit. Regular dental and eye exams. Immunizations. Screening for certain conditions. Healthy lifestyle choices, such as: Eating a healthy diet. Getting regular exercise. Not using drugs or products that contain nicotine and tobacco. Limiting alcohol use. What can I expect for my preventive care visit? Physical exam Your health care provider will check your: Height and weight. These may be used to calculate your BMI (body mass index). BMI is a measurement that tells if you are at a healthy weight. Heart rate and blood pressure. Body temperature. Skin for abnormal spots. Counseling Your health care provider may ask you questions about your: Past medical problems. Family's medical history. Alcohol, tobacco, and drug use. Emotional well-being. Home life and relationship well-being. Sexual activity. Diet, exercise, and sleep habits. Work and work Statistician. Access to firearms. Method of birth control. Menstrual cycle. Pregnancy history. What immunizations do I need? Vaccines are usually given at various ages, according to a schedule. Your health care provider will recommend vaccines for you based on your age, medical history, and lifestyle or other factors, such as travel or where you work. What tests do I need? Blood tests Lipid and cholesterol levels. These may be checked every 5 years, or more often if you are over 40 years old. Hepatitis C test. Hepatitis B test. Screening Lung cancer screening. You may have this screening every year starting at age 52 if you have a 30-pack-year  history of smoking and currently smoke or have quit within the past 15 years. Colorectal cancer screening. All adults should have this screening starting at age 40 and continuing until age 8. Your health care provider may recommend screening at age 39 if you are at increased risk. You will have tests every 1-10 years, depending on your results and the type of screening test. Diabetes screening. This is done by checking your blood sugar (glucose) after you have not eaten for a while (fasting). You may have this done every 1-3 years. Mammogram. This may be done every 1-2 years. Talk with your health care provider about when you should start having regular mammograms. This may depend on whether you have a family history of breast cancer. BRCA-related cancer screening. This may be done if you have a family history of breast, ovarian, tubal, or peritoneal cancers. Pelvic exam and Pap test. This may be done every 3 years starting at age 82. Starting at age 25, this may be done every 5 years if you have a Pap test in combination with an HPV test. Other tests STD (sexually transmitted disease) testing, if you are at risk. Bone density scan. This is done to screen for osteoporosis. You may have this scan if you are at high risk for osteoporosis. Talk with your health care provider about your test results, treatment options, and if necessary, the need for more tests. Follow these instructions at home: Eating and drinking  Eat a diet that includes fresh fruits and vegetables, whole grains, lean protein, and low-fat dairy products. Take vitamin and mineral supplements as recommended by your health care provider. Do not drink alcohol if: Your health care provider  tells you not to drink. You are pregnant, may be pregnant, or are planning to become pregnant. If you drink alcohol: Limit how much you have to 0-1 drink a day. Be aware of how much alcohol is in your drink. In the [place] one drink equals  one 12 oz bottle of beer (355 mL), one 5 oz glass of wine (148 mL), or one 1 oz glass of hard liquor (44 mL). Lifestyle Take daily care of your teeth and gums. Brush your teeth every morning and night with fluoride toothpaste. Floss one time each day. Stay active. Exercise for at least 30 minutes 5 or more days each week. Do not use any products that contain nicotine or tobacco, such as cigarettes, e-cigarettes, and chewing tobacco. If you need help quitting, ask your health care provider. Do not use drugs. If you are sexually active, practice safe sex. Use a condom or other form of protection to prevent STIs (sexually transmitted infections). If you do not wish to become pregnant, use a form of birth control. If you plan to become pregnant, see your health care provider for a prepregnancy visit. If told by your health care provider, take low-dose aspirin daily starting at age 19. Find healthy ways to cope with stress, such as: Meditation, yoga, or listening to music. Journaling. Talking to a trusted person. Spending time with friends and family. Safety Always wear your seat belt while driving or riding in a vehicle. Do not drive: If you have been drinking alcohol. Do not ride with someone who has been drinking. When you are tired or distracted. While texting. Wear a helmet and other protective equipment during sports activities. If you have firearms in your house, make sure you follow all gun safety procedures. What's next? Visit your health care provider once a year for an annual wellness visit. Ask your health care provider how often you should have your eyes and teeth checked. Stay up to date on all vaccines. This information is not intended to replace advice given to you by your health care provider. Make sure you discuss any questions you have with your health care provider. Document Revised: 04/11/2020 Document Reviewed: 10/13/2017 Elsevier Patient Education  2022 [employer].

## 2020-12-04 NOTE — Assessment & Plan Note (Signed)
Doing well on PRN Myrbetriq, continue same.

## 2020-12-04 NOTE — Progress Notes (Signed)
Subjective:    Patient ID: Amanda Ho, female    DOB: 1970-08-31, 50 y.o.   MRN: [id number]  HPI  Amanda Ho is a very pleasant 50 y.o. female who presents today for complete physical and follow up of chronic conditions.  Immunizations: -Tetanus: 2014 -Influenza: Declines  -Covid-19: 2 vaccines  Diet: Fair diet.  Exercise: Walking twice weekly  Eye exam: Completes annually  Dental exam: No recent visit   Pap Smear: Hysterectomy Mammogram: Completed in 2021, gets at GYN office Colonoscopy: Never completed, declines   BP Readings from Last 3 Encounters:  12/04/20 104/64  10/12/19 104/62  02/24/19 (!) 110/57        Review of Systems  Constitutional:  Negative for unexpected weight change.  HENT:  Negative for rhinorrhea.   Eyes:  Negative for visual disturbance.  Respiratory:  Negative for cough and shortness of breath.   Cardiovascular:  Negative for chest pain.  Gastrointestinal:  Negative for constipation and diarrhea.  Genitourinary:  Negative for difficulty urinating.  Musculoskeletal:  Positive for arthralgias.  Skin:  Negative for rash.  Allergic/Immunologic: Negative for environmental allergies.  Neurological:  Negative for dizziness and headaches.  Psychiatric/Behavioral:  The patient is not nervous/anxious.         Past Medical History:  Diagnosis Date   Anemia    Anxiety    Asthma    rarely with URIs   Family history of anesthesia complication    Vertigo postop   Frequent headaches    GERD (gastroesophageal reflux disease)     Social History   Socioeconomic History   Marital status: Married    Spouse name: Not on file   Number of children: Not on file   Years of education: Not on file   Highest education level: Not on file  Occupational History   Not on file  Tobacco Use   Smoking status: Never   Smokeless tobacco: Never  Substance and Sexual Activity   Alcohol use: Yes    Alcohol/week: 1.0 standard drink     Types: 1 Glasses of wine per week    Comment: socially   Drug use: No   Sexual activity: Not on file  Other Topics Concern   Not on file  Social History Narrative   Married.   2 children. 4 grandchildren.   Works as a Herbalist.    Enjoys reading, going to the farmers market, relaxing.   Social Determinants of Health   Financial Resource Strain: Not on file  Food Insecurity: Not on file  Transportation Needs: Not on file  Physical Activity: Not on file  Stress: Not on file  Social Connections: Not on file  Intimate Partner Violence: Not on file    Past Surgical History:  Procedure Laterality Date   ABDOMINAL HYSTERECTOMY  2013   CESAREAN SECTION     x2    LAPAROSCOPIC ASSISTED VAGINAL HYSTERECTOMY  01/12/2012   Procedure: LAPAROSCOPIC ASSISTED VAGINAL HYSTERECTOMY;  Surgeon: Luz Lex, MD;  Location: [facility];  Service: Gynecology;  Laterality: N/A;   TUBAL LIGATION      Family History  Problem Relation Age of Onset   Arthritis Mother    Breast cancer Mother    Mental illness Mother    Mental illness Father    Stroke Father    Hypertension Brother    Breast cancer Maternal Grandmother    Heart disease Maternal Grandmother    Hypertension Maternal Grandmother    Alcohol abuse Maternal Grandfather  Parkinson's disease Paternal Aunt     Allergies  Allergen Reactions   Augmentin [Amoxicillin-Pot Clavulanate] Nausea And Vomiting   Doxycycline Nausea And Vomiting   Erythromycin Nausea And Vomiting   Etodolac Other (See Comments)    Dizziness   Flagyl [Metronidazole] Nausea And Vomiting    Both oral and cream    Hydrocodone-Acetaminophen Nausea And Vomiting   Ibuprofen Nausea And Vomiting   Morphine And Related Other (See Comments)    Sleeps hard, can't wake up   Prednisone Nausea And Vomiting   Sulfa Antibiotics Nausea And Vomiting   Talwin [Pentazocine] Nausea And Vomiting   Sudafed [Pseudoephedrine Hcl] Palpitations    Tachycardia    Current  Outpatient Medications on File Prior to Visit  Medication Sig Dispense Refill   acetaminophen (TYLENOL) 325 MG tablet Take 650 mg by mouth every 6 (six) hours as needed for mild pain or headache.     diphenhydramine-acetaminophen (TYLENOL PM) 25-500 MG TABS tablet Take 0.5 tablets by mouth at bedtime as needed (pain/sleep).     FLUoxetine (PROZAC) 20 MG capsule Take 1 capsule (20 mg total) by mouth daily. For anxiety. Office visit required for further refills, second notification. 30 capsule 0   gabapentin (NEURONTIN) 100 MG capsule TAKE 1-3 CAPSULES BY MOUTH AT BEDTIME FOR RESTLESS LEGS. 30 capsule 0   Mirabegron (MYRBETRIQ PO) Take by mouth as needed.     omeprazole-sodium bicarbonate (ZEGERID) 40-1100 MG per capsule Take 1 capsule by mouth daily before breakfast.     OVER THE COUNTER MEDICATION Take 1 packet by mouth daily. Emergence C packet     No current facility-administered medications on file prior to visit.    BP 104/64   Pulse 88   Temp 97.8 F (36.6 C) (Temporal)   Ht 5\' 4"  (1.626 m)   Wt 153 lb (69.4 kg)   LMP 12/22/2011   SpO2 97%   BMI 26.26 kg/m  Objective:   Physical Exam HENT:     Right Ear: Tympanic membrane and ear canal normal.     Left Ear: Tympanic membrane and ear canal normal.     Nose: Nose normal.  Eyes:     Conjunctiva/sclera: Conjunctivae normal.     Pupils: Pupils are equal, round, and reactive to light.  Neck:     Thyroid: No thyromegaly.  Cardiovascular:     Rate and Rhythm: Normal rate and regular rhythm.     Heart sounds: No murmur heard. Pulmonary:     Effort: Pulmonary effort is normal.     Breath sounds: Normal breath sounds. No rales.  Abdominal:     General: Bowel sounds are normal.     Palpations: Abdomen is soft.     Tenderness: There is no abdominal tenderness.  Musculoskeletal:        General: Normal range of motion.     Cervical back: Neck supple.  Lymphadenopathy:     Cervical: No cervical adenopathy.  Skin:    General:  Skin is warm and dry.     Findings: No rash.  Neurological:     Mental Status: She is alert and oriented to person, place, and time.     Cranial Nerves: No cranial nerve deficit.     Deep Tendon Reflexes: Reflexes are normal and symmetric.  Psychiatric:        Mood and Affect: Mood normal.          Assessment & Plan:      This visit occurred during the SARS-CoV-2  public health emergency.  Safety protocols were in place, including screening questions prior to the visit, additional usage of staff PPE, and extensive cleaning of exam room while observing appropriate contact time as indicated for disinfecting solutions.

## 2020-12-04 NOTE — Assessment & Plan Note (Signed)
Not taking vitamin D. Recommended 2000 IU daily based from last years labs.

## 2020-12-05 LAB — HEPATITIS C ANTIBODY
Hepatitis C Ab: NONREACTIVE
SIGNAL TO CUT-OFF: 0.02 (ref <1.00)

## 2020-12-05 LAB — HIV ANTIBODY (ROUTINE TESTING W REFLEX): HIV 1&2 Ab, 4th Generation: NONREACTIVE

## 2020-12-12 ENCOUNTER — Encounter: Payer: Self-pay | Admitting: Primary Care

## 2021-04-22 DIAGNOSIS — Z01419 Encounter for gynecological examination (general) (routine) without abnormal findings: Secondary | ICD-10-CM | POA: Diagnosis not present

## 2021-04-22 DIAGNOSIS — Z1231 Encounter for screening mammogram for malignant neoplasm of breast: Secondary | ICD-10-CM | POA: Diagnosis not present

## 2021-04-22 DIAGNOSIS — Z6828 Body mass index (BMI) 28.0-28.9, adult: Secondary | ICD-10-CM | POA: Diagnosis not present

## 2021-04-22 LAB — HM MAMMOGRAPHY

## 2021-07-31 IMAGING — CT CT RENAL STONE PROTOCOL
2 of 4 series · 16 of 46 positions shown, 18 images · non-contrast
Comparison: None.

CLINICAL DATA: Left-sided flank pain with dysuria and nausea

EXAM:
CT ABDOMEN AND PELVIS WITHOUT CONTRAST
TECHNIQUE: Multidetector CT imaging of the abdomen and pelvis was performed
following the standard protocol without oral or IV contrast.

[Series 2: axial st · axial · 0.72mm/px · z∈[-453,-33]mm · 13 of 97 slices shown, 15 images]
[im 7/97  soft-tissue]
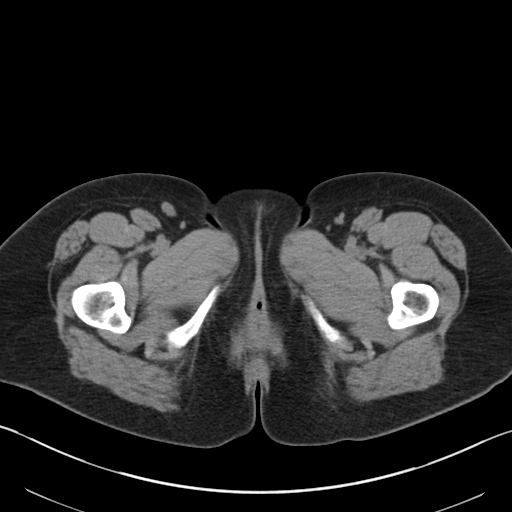
[im 7/97  bone]
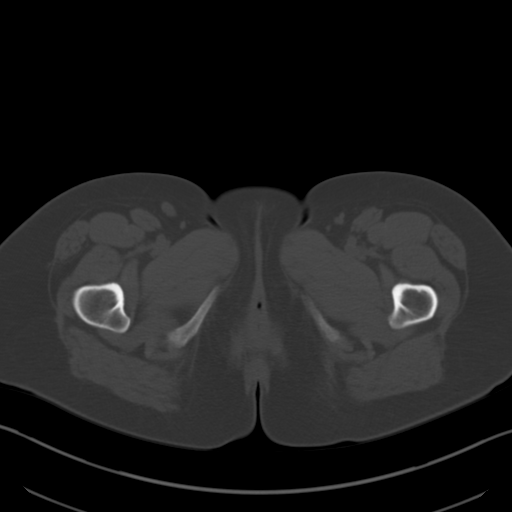
[im 13/97  soft-tissue]
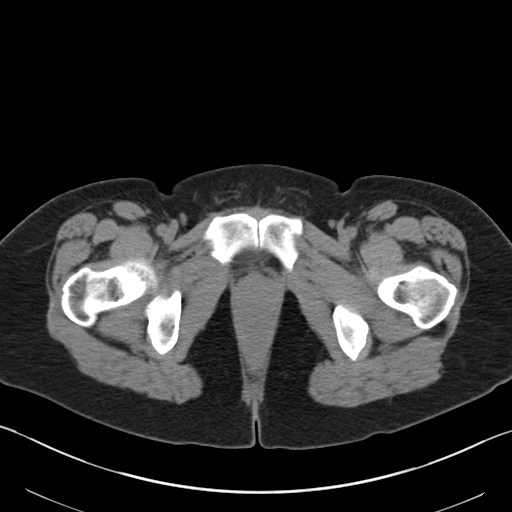
[im 19/97  soft-tissue]
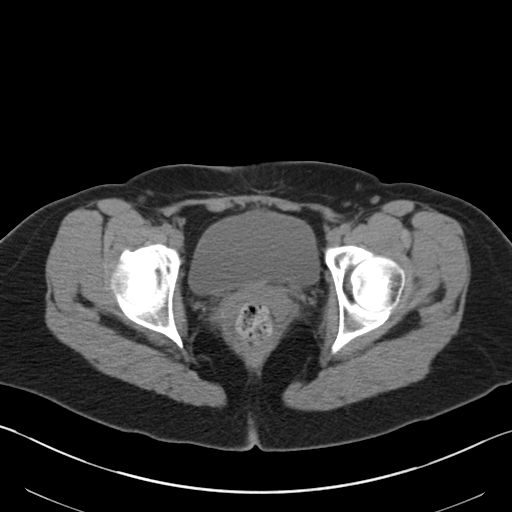
[im 31/97  soft-tissue]
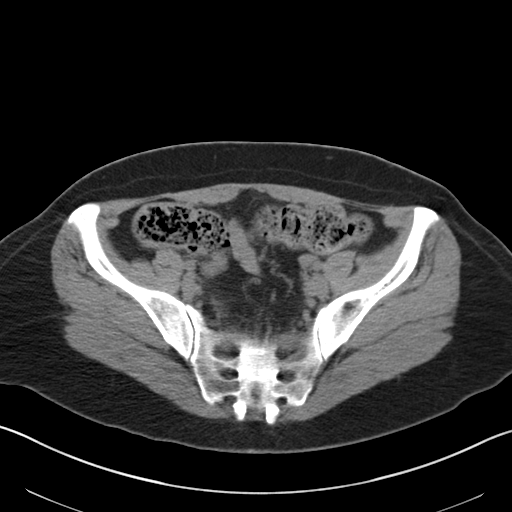
[im 37/97  soft-tissue]
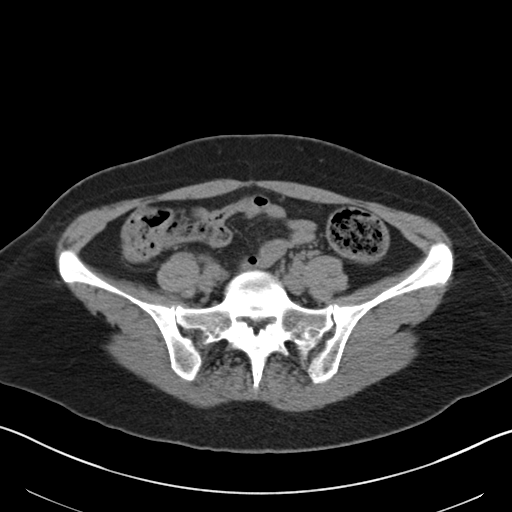
[im 43/97  soft-tissue]
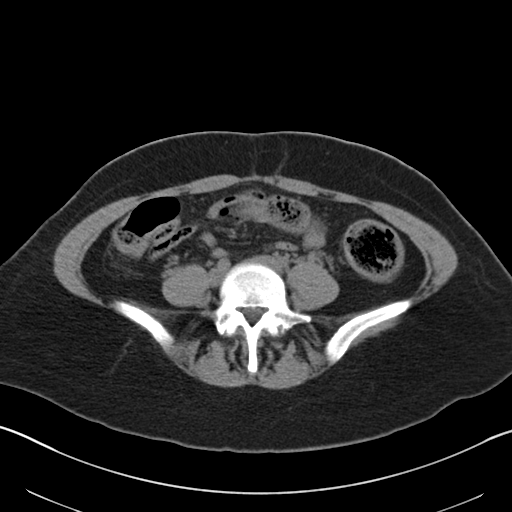
[im 49/97  soft-tissue]
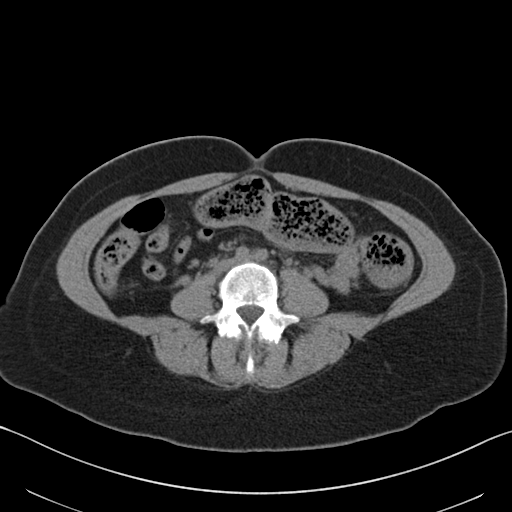
[im 55/97  soft-tissue]
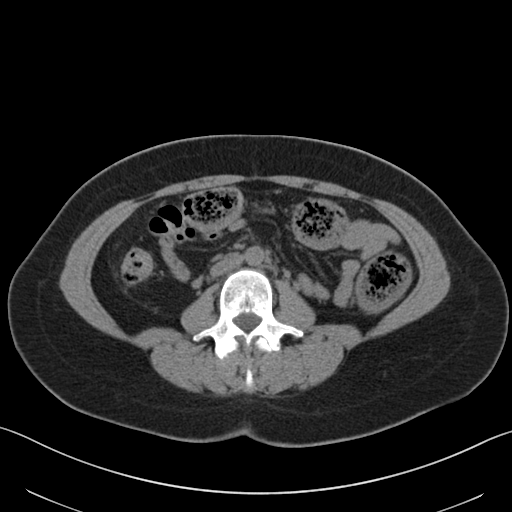
[im 61/97  soft-tissue]
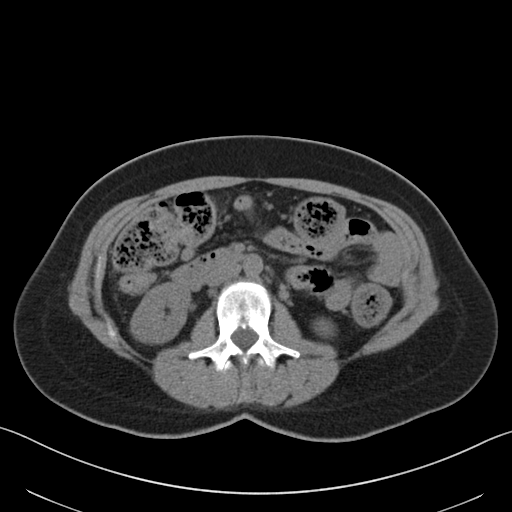
[im 61/97  bone]
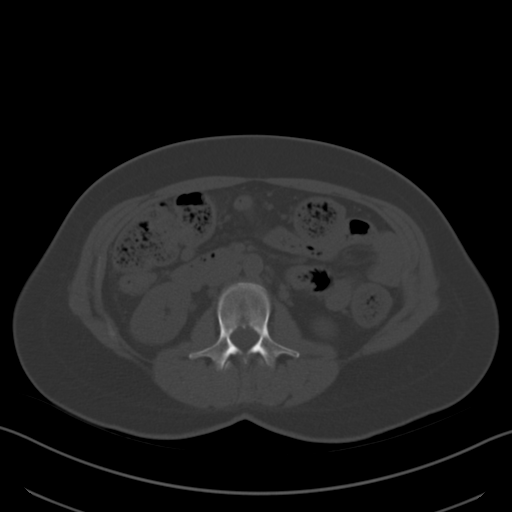
[im 67/97  soft-tissue]
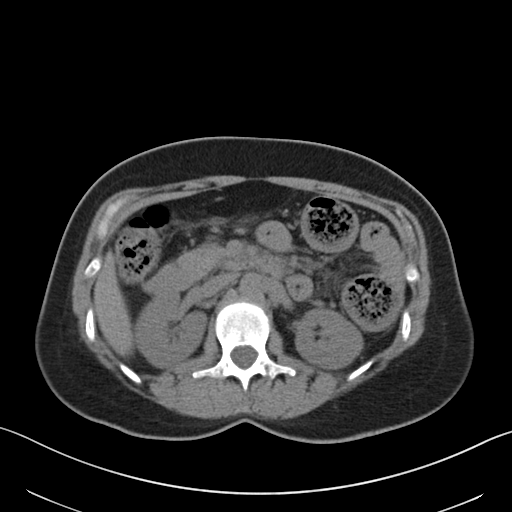
[im 79/97  soft-tissue]
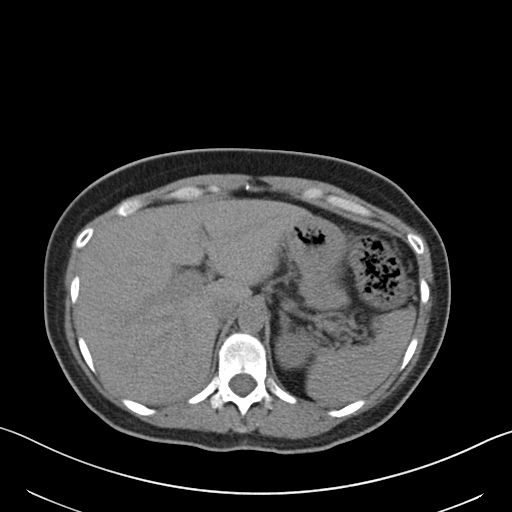
[im 85/97  soft-tissue]
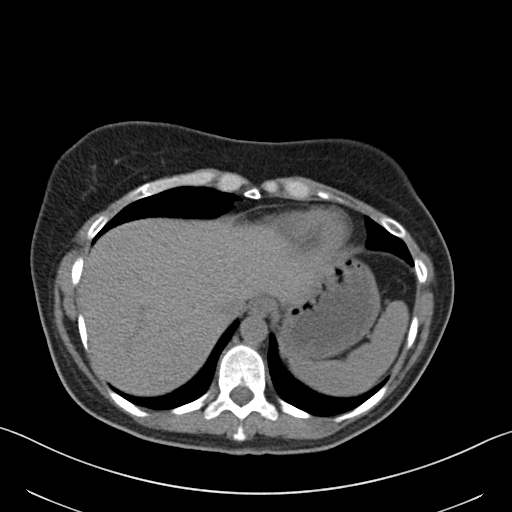
[im 91/97  soft-tissue]
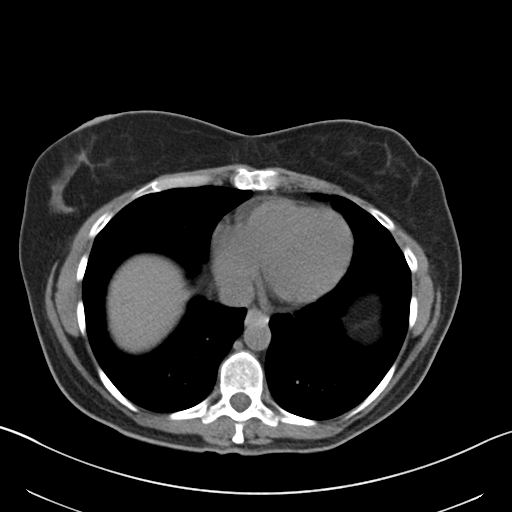

[Series 4: coronal · coronal · 0.81mm/px · 3 of 113 slices shown]
[im 38/113  soft-tissue]
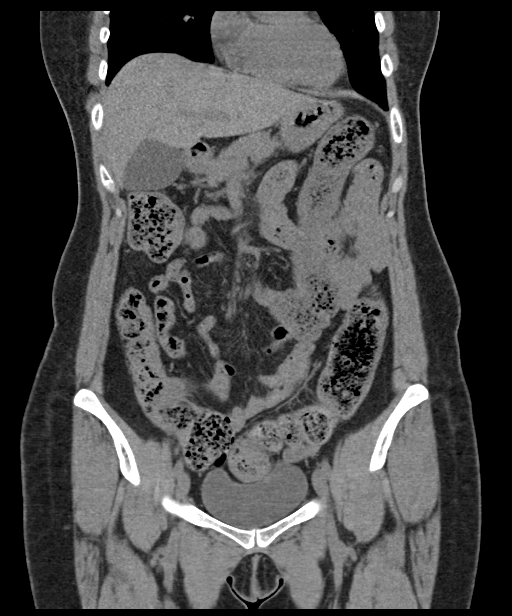
[im 50/113  soft-tissue]
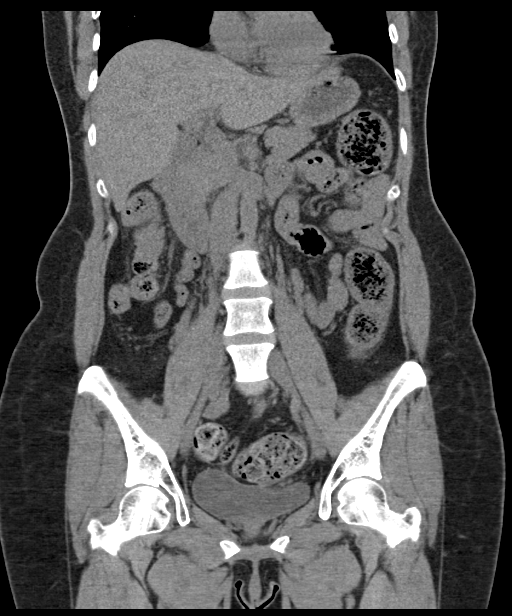
[im 63/113  soft-tissue]
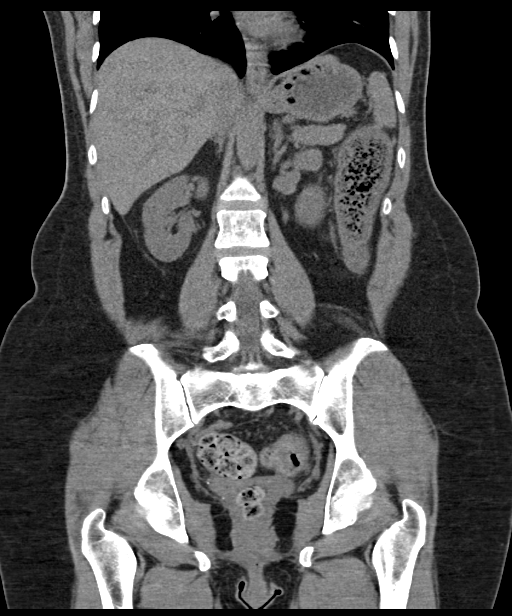

[16 of 46 positions shown; findings below may reference images not displayed]

FINDINGS: Lower chest: Lung bases are clear.

Hepatobiliary: No focal liver lesions are appreciable on this
noncontrast enhanced study. Gallbladder wall is not appreciably
thickened. There is no biliary duct dilatation.

Pancreas: There is no pancreatic mass or inflammatory focus.

Spleen: No splenic lesions are evident.

Adrenals/Urinary Tract: Adrenals bilaterally appear normal. Kidneys
bilaterally show no evident mass or hydronephrosis on either side.
There is no appreciable renal or ureteral calculus on either side.
Urinary bladder is midline with wall thickness within normal limits.

Stomach/Bowel: There is diffuse stool throughout colon. There is
wall thickening throughout much of the colon. No small bowel wall
thickening is noted. There is no evident bowel obstruction. The
terminal ileum appears normal. There is no evident free air or
portal venous air.

Vascular/Lymphatic: There is no abdominal aortic aneurysm. No
vascular lesions are evident this noncontrast enhanced study. There
is no appreciable adenopathy in the abdomen or pelvis.

Reproductive: Uterus is absent.  There is no evident pelvic mass.

Other: Appendix region appears unremarkable. There is no abscess or
ascites in the abdomen or pelvis. There is slight fat in the
umbilicus.

Musculoskeletal: No blastic or lytic bone lesions. No intramuscular
lesions are evident.
IMPRESSION: 1. Diffuse stool throughout colon. There is mild generalized colonic
wall thickening, a finding indicative of a degree of colitis. No
small bowel wall thickening noted. No evident bowel obstruction.

2. No evident renal or ureteral calculus. No hydronephrosis on
either side. Urinary bladder wall thickness is within normal limits.

3. No abscess in the abdomen pelvis. Appendix region appears normal.

4.  Uterus absent.

## 2022-01-27 ENCOUNTER — Telehealth: Payer: Self-pay | Admitting: Physician Assistant

## 2022-01-27 DIAGNOSIS — U071 COVID-19: Secondary | ICD-10-CM

## 2022-01-27 MED ORDER — BENZONATATE 100 MG PO CAPS: 100.0000 mg | ORAL_CAPSULE | Freq: Three times a day (TID) | ORAL | 0 refills | Status: DC | PRN

## 2022-01-27 MED ORDER — MOLNUPIRAVIR EUA 200MG CAPSULE: 4.0000 | ORAL_CAPSULE | Freq: Two times a day (BID) | ORAL | 0 refills | Status: AC

## 2022-01-27 NOTE — Patient Instructions (Addendum)
Amanda Ho, thank you for joining Leeanne Rio, PA-C for today's virtual visit.  While this provider is not your primary care provider (PCP), if your PCP is located in our provider database this encounter information will be shared with them immediately following your visit.   [facility] account gives you access to today's visit and all your visits, tests, and labs performed at [facility] " click here if you don't have a [facility] account or go to [email or web address]  Consent: (Patient) Amanda Ho provided verbal consent for this virtual visit at the beginning of the encounter.  Current Medications:  Current Outpatient Medications:    acetaminophen (TYLENOL) 325 MG tablet, Take 650 mg by mouth every 6 (six) hours as needed for mild pain or headache., Disp: , Rfl:    diphenhydramine-acetaminophen (TYLENOL PM) 25-500 MG TABS tablet, Take 0.5 tablets by mouth at bedtime as needed (pain/sleep)., Disp: , Rfl:    FLUoxetine (PROZAC) 20 MG capsule, Take 1 capsule (20 mg total) by mouth daily. For anxiety and depression., Disp: 90 capsule, Rfl: 3   gabapentin (NEURONTIN) 100 MG capsule, TAKE 1-3 CAPSULES BY MOUTH AT BEDTIME FOR RESTLESS LEGS., Disp: 30 capsule, Rfl: 0   Mirabegron (MYRBETRIQ PO), Take by mouth as needed., Disp: , Rfl:    omeprazole-sodium bicarbonate (ZEGERID) 40-1100 MG per capsule, Take 1 capsule by mouth daily before breakfast., Disp: , Rfl:    OVER THE COUNTER MEDICATION, Take 1 packet by mouth daily. Emergence C packet, Disp: , Rfl:    Medications ordered in this encounter:  No orders of the defined types were placed in this encounter.    *If you need refills on other medications prior to your next appointment, please contact your pharmacy*  Follow-Up: Call back or seek an in-person evaluation if the symptoms worsen or if the condition fails to improve as anticipated.  [place] [phone]  Other Instructions Please keep well-hydrated and get plenty of rest. Start a saline nasal rinse to flush out your nasal passages. You can use plain Mucinex to help thin congestion. If you have a humidifier, running in the bedroom at night. I want you to start OTC vitamin D3 1000 units daily, vitamin C 1000 mg daily, and a zinc supplement. Please take prescribed medications as directed.  You have been enrolled in a MyChart symptom monitoring program. Please answer these questions daily so we can keep track of how you are doing.  You were to quarantine for 5 days from onset of your symptoms.  After day 5, if you have had no fever and you are feeling better, you can end quarantine but need to mask for an additional 5 days. After day 5 if you have a fever or are having significant symptoms, please quarantine for full 10 days.  If you note any worsening of symptoms, any significant shortness of breath or any chest pain, please seek ER evaluation ASAP.  Please do not delay care!  COVID-19: What to Do if You Are Sick If you test positive and are an older adult or someone who is at high risk of getting very sick from COVID-19, treatment may be available. Contact a healthcare provider right away after a positive test to determine if you are eligible, even if your symptoms are mild right now. You can also visit a Test to Treat location and, if eligible, receive a prescription from a provider. Don't delay: Treatment must be started within the first few  days to be effective. If you have a fever, cough, or other symptoms, you might have COVID-19. Most people have mild illness and are able to recover at home. If you are sick: Keep track of your symptoms. If you have an emergency warning sign (including trouble breathing), call 911. Steps to help prevent the spread of COVID-19 if you are sick If you are sick with COVID-19 or think you might have COVID-19, follow the steps below to care for  yourself and to help protect other people in your home and community. Stay home except to get medical care Stay home. Most people with COVID-19 have mild illness and can recover at home without medical care. Do not leave your home, except to get medical care. Do not visit public areas and do not go to places where you are unable to wear a mask. Take care of yourself. Get rest and stay hydrated. Take over-the-counter medicines, such as acetaminophen, to help you feel better. Stay in touch with your doctor. Call before you get medical care. Be sure to get care if you have trouble breathing, or have any other emergency warning signs, or if you think it is an emergency. Avoid public transportation, ride-sharing, or taxis if possible. Get tested If you have symptoms of COVID-19, get tested. While waiting for test results, stay away from others, including staying apart from those living in your household. Get tested as soon as possible after your symptoms start. Treatments may be available for people with COVID-19 who are at risk for becoming very sick. Don't delay: Treatment must be started early to be effective--some treatments must begin within 5 days of your first symptoms. Contact your healthcare provider right away if your test result is positive to determine if you are eligible. Self-tests are one of several options for testing for the virus that causes COVID-19 and may be more convenient than laboratory-based tests and point-of-care tests. Ask your healthcare provider or your local health department if you need help interpreting your test results. You can visit your state, tribal, local, and territorial health department's website to look for the latest local information on testing sites. Separate yourself from other people As much as possible, stay in a specific room and away from other people and pets in your home. If possible, you should use a separate bathroom. If you need to be around other people  or animals in or outside of the home, wear a well-fitting mask. Tell your close contacts that they may have been exposed to COVID-19. An infected person can spread COVID-19 starting 48 hours (or 2 days) before the person has any symptoms or tests positive. By letting your close contacts know they may have been exposed to COVID-19, you are helping to protect everyone. See COVID-19 and Animals if you have questions about pets. If you are diagnosed with COVID-19, someone from the health department may call you. Answer the call to slow the spread. Monitor your symptoms Symptoms of COVID-19 include fever, cough, or other symptoms. Follow care instructions from your healthcare provider and local health department. Your local health authorities may give instructions on checking your symptoms and reporting information. When to seek emergency medical attention Look for emergency warning signs* for COVID-19. If someone is showing any of these signs, seek emergency medical care immediately: Trouble breathing Persistent pain or pressure in the chest New confusion Inability to wake or stay awake Pale, gray, or blue-colored skin, lips, or nail beds, depending on skin tone *This list is  not all possible symptoms. Please call your medical provider for any other symptoms that are severe or concerning to you. Call 911 or call ahead to your local emergency facility: Notify the operator that you are seeking care for someone who has or may have COVID-19. Call ahead before visiting your doctor Call ahead. Many medical visits for routine care are being postponed or done by phone or telemedicine. If you have a medical appointment that cannot be postponed, call your doctor's office, and tell them you have or may have COVID-19. This will help the office protect themselves and other patients. If you are sick, wear a well-fitting mask You should wear a mask if you must be around other people or animals, including pets (even  at home). Wear a mask with the best fit, protection, and comfort for you. You don't need to wear the mask if you are alone. If you can't put on a mask (because of trouble breathing, for example), cover your coughs and sneezes in some other way. Try to stay at least 6 feet away from other people. This will help protect the people around you. Masks should not be placed on young children under age 24 years, anyone who has trouble breathing, or anyone who is not able to remove the mask without help. Cover your coughs and sneezes Cover your mouth and nose with a tissue when you cough or sneeze. Throw away used tissues in a lined trash can. Immediately wash your hands with soap and water for at least 20 seconds. If soap and water are not available, clean your hands with an alcohol-based hand sanitizer that contains at least 60% alcohol. Clean your hands often Wash your hands often with soap and water for at least 20 seconds. This is especially important after blowing your nose, coughing, or sneezing; going to the bathroom; and before eating or preparing food. Use hand sanitizer if soap and water are not available. Use an alcohol-based hand sanitizer with at least 60% alcohol, covering all surfaces of your hands and rubbing them together until they feel dry. Soap and water are the best option, especially if hands are visibly dirty. Avoid touching your eyes, nose, and mouth with unwashed hands. Handwashing Tips Avoid sharing personal household items Do not share dishes, drinking glasses, cups, eating utensils, towels, or bedding with other people in your home. Wash these items thoroughly after using them with soap and water or put in the dishwasher. Clean surfaces in your home regularly Clean and disinfect high-touch surfaces (for example, doorknobs, tables, handles, light switches, and countertops) in your "sick room" and bathroom. In shared spaces, you should clean and disinfect surfaces and items after  each use by the person who is ill. If you are sick and cannot clean, a caregiver or other person should only clean and disinfect the area around you (such as your bedroom and bathroom) on an as needed basis. Your caregiver/other person should wait as long as possible (at least several hours) and wear a mask before entering, cleaning, and disinfecting shared spaces that you use. Clean and disinfect areas that may have blood, stool, or body fluids on them. Use household cleaners and disinfectants. Clean visible dirty surfaces with household cleaners containing soap or detergent. Then, use a household disinfectant. Use a product from [employer] List N: Disinfectants for Coronavirus (EYCXK-48). Be sure to follow the instructions on the label to ensure safe and effective use of the product. Many products recommend keeping the surface wet with a disinfectant  for a certain period of time (look at "contact time" on the product label). You may also need to wear personal protective equipment, such as gloves, depending on the directions on the product label. Immediately after disinfecting, wash your hands with soap and water for 20 seconds. For completed guidance on cleaning and disinfecting your home, visit Complete Disinfection Guidance. Take steps to improve ventilation at home Improve ventilation (air flow) at home to help prevent from spreading COVID-19 to other people in your household. Clear out COVID-19 virus particles in the air by opening windows, using air filters, and turning on fans in your home. Use this interactive tool to learn how to improve air flow in your home. When you can be around others after being sick with COVID-19 Deciding when you can be around others is different for different situations. Find out when you can safely end home isolation. For any additional questions about your care, contact your healthcare provider or state or local health department. 05/06/2020 Content source: [facility] ([facility]), Division of Viral Diseases This information is not intended to replace advice given to you by your health care provider. Make sure you discuss any questions you have with your health care provider. Document Revised: 06/19/2020 Document Reviewed: 06/19/2020 Elsevier Patient Education  2022 [employer].  If you have been instructed to have an in-person evaluation today at a local Urgent Care facility, please use the link below. It will take you to a list of all of our available [facility], including address, phone number and hours of operation. Please do not delay care.  [facility]  If you or a family member do not have a primary care provider, use the link below to schedule a visit and establish care. When you choose a [facility] primary care physician or advanced practice provider, you gain a long-term partner in health. Find a Primary Care Provider  Learn more about [facility]'s in-office and virtual care options: [facility]

## 2022-01-27 NOTE — Progress Notes (Signed)
Virtual Visit Consent   [name], you are scheduled for a virtual visit with a [facility] provider today. Just as with appointments in the office, your consent must be obtained to participate. Your consent will be active for this visit and any virtual visit you may have with one of our providers in the next 365 days. If you have a MyChart account, a copy of this consent can be sent to you electronically.  As this is a virtual visit, video technology does not allow for your provider to perform a traditional examination. This may limit your provider's ability to fully assess your condition. If your provider identifies any concerns that need to be evaluated in person or the need to arrange testing (such as labs, EKG, etc.), we will make arrangements to do so. Although advances in technology are sophisticated, we cannot ensure that it will always work on either your end or our end. If the connection with a video visit is poor, the visit may have to be switched to a telephone visit. With either a video or telephone visit, we are not always able to ensure that we have a secure connection.  By engaging in this virtual visit, you consent to the provision of healthcare and authorize for your insurance to be billed (if applicable) for the services provided during this visit. Depending on your insurance coverage, you may receive a charge related to this service.  I need to obtain your verbal consent now. Are you willing to proceed with your visit today? Amanda Ho has provided verbal consent on 01/27/2022 for a virtual visit (video or telephone). Leeanne Rio, [place]  Date: 01/27/2022 5:03 PM  Virtual Visit via Video Note   I, Leeanne Rio, connected with  Amanda Ho  ([id number], 1970-10-17) on 01/27/22 at  4:45 PM EST by a video-enabled telemedicine application and verified that I am speaking with the correct person using two identifiers.  Location: Patient: Virtual  Visit Location Patient: Home Provider: Virtual Visit Location Provider: Home Office   I discussed the limitations of evaluation and management by telemedicine and the availability of in person appointments. The patient expressed understanding and agreed to proceed.    History of Present Illness: Amanda Ho is a 51 y.o. who identifies as a female who was assigned female at birth, and is being seen today for COVID-19. Symptoms starting yesterday with fatigue, chest congestion, productive cough. Denies fever, aches. Notes chills yesterday. Denies SOB. Tested positive for COVID on home test yesterday. Some nausea but no other GI symptoms. Is taking Robitussin, Tylenol Cold and Flu OTC. Husband also with COVID.   HPI: HPI  Problems:  Patient Active Problem List   Diagnosis Date Noted   Lower extremity pain 10/12/2019   Vitamin D deficiency 10/12/2019   Overactive bladder 10/12/2019   Preventative health care 02/21/2017   GAD (generalized anxiety disorder) 11/05/2015   GERD (gastroesophageal reflux disease) 11/05/2015   Panic disorder 03/25/2015   Recurrent major depressive disorder, in full remission ([place]) 03/25/2015    Allergies:  Allergies  Allergen Reactions   Augmentin [Amoxicillin-Pot Clavulanate] Nausea And Vomiting   Doxycycline Nausea And Vomiting   Erythromycin Nausea And Vomiting   Etodolac Other (See Comments)    Dizziness   Flagyl [Metronidazole] Nausea And Vomiting    Both oral and cream    Hydrocodone-Acetaminophen Nausea And Vomiting   Ibuprofen Nausea And Vomiting   Morphine And Related Other (See Comments)    Sleeps hard, can't wake up  Prednisone Nausea And Vomiting   Sulfa Antibiotics Nausea And Vomiting   Talwin [Pentazocine] Nausea And Vomiting   Sudafed [Pseudoephedrine Hcl] Palpitations    Tachycardia   Medications:  Current Outpatient Medications:    benzonatate (TESSALON) 100 MG capsule, Take 1 capsule (100 mg total) by mouth 3 (three) times  daily as needed for cough., Disp: 30 capsule, Rfl: 0   molnupiravir EUA (LAGEVRIO) 200 mg CAPS capsule, Take 4 capsules (800 mg total) by mouth 2 (two) times daily for 5 days., Disp: 40 capsule, Rfl: 0   acetaminophen (TYLENOL) 325 MG tablet, Take 650 mg by mouth every 6 (six) hours as needed for mild pain or headache., Disp: , Rfl:    diphenhydramine-acetaminophen (TYLENOL PM) 25-500 MG TABS tablet, Take 0.5 tablets by mouth at bedtime as needed (pain/sleep)., Disp: , Rfl:    FLUoxetine (PROZAC) 20 MG capsule, Take 1 capsule (20 mg total) by mouth daily. For anxiety and depression., Disp: 90 capsule, Rfl: 3   gabapentin (NEURONTIN) 100 MG capsule, TAKE 1-3 CAPSULES BY MOUTH AT BEDTIME FOR RESTLESS LEGS., Disp: 30 capsule, Rfl: 0   Mirabegron (MYRBETRIQ PO), Take by mouth as needed., Disp: , Rfl:    omeprazole-sodium bicarbonate (ZEGERID) 40-1100 MG per capsule, Take 1 capsule by mouth daily before breakfast., Disp: , Rfl:    OVER THE COUNTER MEDICATION, Take 1 packet by mouth daily. Emergence C packet, Disp: , Rfl:   Observations/Objective: Patient is well-developed, well-nourished in no acute distress.  Resting comfortably at home.  Head is normocephalic, atraumatic.  No labored breathing. Speech is clear and coherent with logical content.  Patient is alert and oriented at baseline.   Assessment and Plan: 1. COVID-19 - benzonatate (TESSALON) 100 MG capsule; Take 1 capsule (100 mg total) by mouth 3 (three) times daily as needed for cough.  Dispense: 30 capsule; Refill: 0 - molnupiravir EUA (LAGEVRIO) 200 mg CAPS capsule; Take 4 capsules (800 mg total) by mouth 2 (two) times daily for 5 days.  Dispense: 40 capsule; Refill: 0 - MyChart COVID-19 home monitoring program; Future Discussed risks/benefits of antiviral medications including most common potential ADRs. Patient voiced understanding and would like to proceed with antiviral medication. They are candidate for molnupiravir. Rx sent to  pharmacy. Supportive measures, OTC medications and vitamin regimen reviewed.  Tessalon per orders. Patient has been enrolled in a MyChart COVID symptom monitoring program. Samule Dry reviewed in detail. Strict ER precautions discussed with patient.    Follow Up Instructions: I discussed the assessment and treatment plan with the patient. The patient was provided an opportunity to ask questions and all were answered. The patient agreed with the plan and demonstrated an understanding of the instructions.  A copy of instructions were sent to the patient via MyChart unless otherwise noted below.   The patient was advised to call back or seek an in-person evaluation if the symptoms worsen or if the condition fails to improve as anticipated.  Time:  I spent 10 minutes with the patient via telehealth technology discussing the above problems/concerns.    Leeanne Rio, PA-C

## 2022-01-27 NOTE — Telephone Encounter (Signed)
Patient has been scheduled for a virtual appointment. Thank you!

## 2022-03-03 ENCOUNTER — Other Ambulatory Visit: Payer: Self-pay | Admitting: Primary Care

## 2022-03-03 DIAGNOSIS — F411 Generalized anxiety disorder: Secondary | ICD-10-CM

## 2022-03-04 NOTE — Telephone Encounter (Signed)
Patient is overdue for CPE/follow up, this will be required prior to any further refills.  Please schedule, thank you!

## 2022-03-04 NOTE — Telephone Encounter (Signed)
Patient will callback in to schedule. She was driving and not able to look at her calendar.

## 2022-03-09 ENCOUNTER — Ambulatory Visit (INDEPENDENT_AMBULATORY_CARE_PROVIDER_SITE_OTHER)
Admission: RE | Admit: 2022-03-09 | Discharge: 2022-03-09 | Disposition: A | Discharge status: Home / Self Care | Payer: BC Managed Care – PPO | Source: Ambulatory Visit | Attending: Primary Care | Admitting: Primary Care

## 2022-03-09 ENCOUNTER — Encounter: Payer: Self-pay | Admitting: Primary Care

## 2022-03-09 ENCOUNTER — Ambulatory Visit (INDEPENDENT_AMBULATORY_CARE_PROVIDER_SITE_OTHER): Payer: BC Managed Care – PPO | Admitting: Primary Care

## 2022-03-09 VITALS — BP 112/68 | HR 64 | Temp 98.1°F | Ht 64.0 in | Wt 173.0 lb

## 2022-03-09 DIAGNOSIS — M79672 Pain in left foot: Secondary | ICD-10-CM | POA: Diagnosis not present

## 2022-03-09 DIAGNOSIS — F411 Generalized anxiety disorder: Secondary | ICD-10-CM

## 2022-03-09 DIAGNOSIS — K219 Gastro-esophageal reflux disease without esophagitis: Secondary | ICD-10-CM | POA: Diagnosis not present

## 2022-03-09 DIAGNOSIS — N3281 Overactive bladder: Secondary | ICD-10-CM

## 2022-03-09 DIAGNOSIS — Z23 Encounter for immunization: Secondary | ICD-10-CM

## 2022-03-09 DIAGNOSIS — Z0001 Encounter for general adult medical examination with abnormal findings: Secondary | ICD-10-CM | POA: Diagnosis not present

## 2022-03-09 DIAGNOSIS — M79605 Pain in left leg: Secondary | ICD-10-CM | POA: Diagnosis not present

## 2022-03-09 DIAGNOSIS — Z1211 Encounter for screening for malignant neoplasm of colon: Secondary | ICD-10-CM

## 2022-03-09 DIAGNOSIS — F3342 Major depressive disorder, recurrent, in full remission: Secondary | ICD-10-CM

## 2022-03-09 DIAGNOSIS — M79604 Pain in right leg: Secondary | ICD-10-CM

## 2022-03-09 DIAGNOSIS — M19072 Primary osteoarthritis, left ankle and foot: Secondary | ICD-10-CM | POA: Diagnosis not present

## 2022-03-09 HISTORY — DX: Pain in left foot: M79.672

## 2022-03-09 NOTE — Assessment & Plan Note (Signed)
Controlled.  No concerns today. Remain off treatment.

## 2022-03-09 NOTE — Assessment & Plan Note (Signed)
Controlled.  Continue fluoxetine 20 mg daily.

## 2022-03-09 NOTE — Progress Notes (Signed)
Subjective:    Patient ID: Amanda Ho, female    DOB: [birth year], 52 y.o.   MRN: [id number]  HPI  Amanda Ho is a very pleasant 52 y.o. female who presents today for complete physical and follow up of chronic conditions.  She would also like to discuss acute foot pain. Acute to the left dorsal 3rd toe and generalized foot for 3 weeks. She can only wear 2 pairs of shoes. She feels like "I broke my toe". She denies injury/trauma. She walks a lot at work, walking up and down stairs. Her pain is worse when her foot is cold and also first thing in the morning. She's also noticed left lateral calf pain. She denies numbness/tingling. She will take Tylenol on occasion with improvement in pain.   Immunizations: -Tetanus: Completed in 2014, due today -Influenza: Declines -Shingles: Never completed, declines today  Diet: Fair diet.  Exercise: No regular exercise.   Eye exam: Completed in 2022 Dental exam: Completed > 1 year ago  Pap Smear: Hysterectomy  Mammogram: Completes at GYN office, last one in 2022 and 2023.  Colonoscopy:  Never completed, declined at last office visit. She agrees this year.   BP Readings from Last 3 Encounters:  03/09/22 112/68  12/04/20 104/64  10/12/19 104/62      Review of Systems  Constitutional:  Negative for unexpected weight change.  HENT:  Negative for rhinorrhea.   Respiratory:  Negative for cough and shortness of breath.   Cardiovascular:  Negative for chest pain.  Gastrointestinal:  Negative for constipation and diarrhea.  Genitourinary:  Negative for difficulty urinating and menstrual problem.  Musculoskeletal:  Positive for arthralgias.  Skin:  Negative for rash.  Allergic/Immunologic: Negative for environmental allergies.  Neurological:  Negative for dizziness, numbness and headaches.  Psychiatric/Behavioral:  The patient is not nervous/anxious.          Past Medical History:  Diagnosis Date   Anemia    Anxiety     Asthma    rarely with URIs   Colitis 06/01/2019   Family history of anesthesia complication    Vertigo postop   Frequent headaches    GERD (gastroesophageal reflux disease)     Social History   Socioeconomic History   Marital status: Married    Spouse name: Not on file   Number of children: Not on file   Years of education: Not on file   Highest education level: Not on file  Occupational History   Not on file  Tobacco Use   Smoking status: Never   Smokeless tobacco: Never  Substance and Sexual Activity   Alcohol use: Yes    Alcohol/week: 1.0 standard drink of alcohol    Types: 1 Glasses of wine per week    Comment: socially   Drug use: No   Sexual activity: Not on file  Other Topics Concern   Not on file  Social History Narrative   Married.   2 children. 4 grandchildren.   Works as a Herbalist.    Enjoys reading, going to the farmers market, relaxing.   Social Determinants of Health   Financial Resource Strain: Not on file  Food Insecurity: Not on file  Transportation Needs: Not on file  Physical Activity: Not on file  Stress: Not on file  Social Connections: Not on file  Intimate Partner Violence: Not on file    Past Surgical History:  Procedure Laterality Date   ABDOMINAL HYSTERECTOMY  2013   [place]  x2    LAPAROSCOPIC ASSISTED VAGINAL HYSTERECTOMY  01/12/2012   Procedure: LAPAROSCOPIC ASSISTED VAGINAL HYSTERECTOMY;  Surgeon: Luz Lex, MD;  Location: [facility];  Service: Gynecology;  Laterality: N/A;   TUBAL LIGATION      Family History  Problem Relation Age of Onset   Arthritis Mother    Breast cancer Mother    Mental illness Mother    Mental illness Father    Stroke Father    Liver disease Father        NAFLD   Bone cancer Father    Liver disease Sister        NAFLD   Hypertension Brother    Breast cancer Maternal Grandmother    Heart disease Maternal Grandmother    Hypertension Maternal Grandmother    Alcohol abuse  Maternal Grandfather    Parkinson's disease Paternal Aunt     Allergies  Allergen Reactions   Augmentin [Amoxicillin-Pot Clavulanate] Nausea And Vomiting   Doxycycline Nausea And Vomiting   Erythromycin Nausea And Vomiting   Etodolac Other (See Comments)    Dizziness   Flagyl [Metronidazole] Nausea And Vomiting    Both oral and cream    Hydrocodone-Acetaminophen Nausea And Vomiting   Ibuprofen Nausea And Vomiting   Morphine And Related Other (See Comments)    Sleeps hard, can't wake up   Prednisone Nausea And Vomiting   Sulfa Antibiotics Nausea And Vomiting   Talwin [Pentazocine] Nausea And Vomiting   Sudafed [Pseudoephedrine Hcl] Palpitations    Tachycardia    Current Outpatient Medications on File Prior to Visit  Medication Sig Dispense Refill   acetaminophen (TYLENOL) 325 MG tablet Take 650 mg by mouth every 6 (six) hours as needed for mild pain or headache.     diphenhydramine-acetaminophen (TYLENOL PM) 25-500 MG TABS tablet Take 0.5 tablets by mouth at bedtime as needed (pain/sleep).     FLUoxetine (PROZAC) 20 MG capsule Take 1 capsule (20 mg total) by mouth daily. For anxiety and depression. 90 capsule 3   omeprazole-sodium bicarbonate (ZEGERID) 40-1100 MG per capsule Take 1 capsule by mouth daily before breakfast.     OVER THE COUNTER MEDICATION Take 1 packet by mouth daily. Emergence C packet     benzonatate (TESSALON) 100 MG capsule Take 1 capsule (100 mg total) by mouth 3 (three) times daily as needed for cough. 30 capsule 0   gabapentin (NEURONTIN) 100 MG capsule TAKE 1-3 CAPSULES BY MOUTH AT BEDTIME FOR RESTLESS LEGS. 30 capsule 0   Mirabegron (MYRBETRIQ PO) Take by mouth as needed.     No current facility-administered medications on file prior to visit.    BP 112/68   Pulse 64   Temp 98.1 F (36.7 C) (Temporal)   Ht '5\' 4"'$  (1.626 m)   Wt 173 lb (78.5 kg)   LMP 12/21/2011   SpO2 98%   BMI 29.70 kg/m  Objective:   Physical Exam HENT:     Right Ear:  Tympanic membrane and ear canal normal.     Left Ear: Tympanic membrane and ear canal normal.     Nose: Nose normal.  Eyes:     Conjunctiva/sclera: Conjunctivae normal.     Pupils: Pupils are equal, round, and reactive to light.  Neck:     Thyroid: No thyromegaly.  Cardiovascular:     Rate and Rhythm: Normal rate and regular rhythm.     Heart sounds: No murmur heard. Pulmonary:     Effort: Pulmonary effort is normal.  Breath sounds: Normal breath sounds. No rales.  Abdominal:     General: Bowel sounds are normal.     Palpations: Abdomen is soft.     Tenderness: There is no abdominal tenderness.  Musculoskeletal:        General: Normal range of motion.     Cervical back: Neck supple.     Left foot: Normal range of motion. No deformity, tenderness or bony tenderness.  Lymphadenopathy:     Cervical: No cervical adenopathy.  Skin:    General: Skin is warm and dry.     Findings: No rash.  Neurological:     Mental Status: She is alert and oriented to person, place, and time.     Cranial Nerves: No cranial nerve deficit.     Deep Tendon Reflexes: Reflexes are normal and symmetric.  Psychiatric:        Mood and Affect: Mood normal.           Assessment & Plan:  Encounter for annual general medical examination with abnormal findings in adult Assessment & Plan: Tetanus due, provided today. Declines Shingrix vaccines. Mammogram UTD at GYN office. Agrees for colonoscopy, referral placed to GI  Discussed the importance of a healthy diet and regular exercise in order for weight loss, and to reduce the risk of further co-morbidity.  Exam stable. Labs pending.  Follow up in 1 year for repeat physical.   Orders: -     Lipid panel -     Comprehensive metabolic panel  Gastroesophageal reflux disease, unspecified whether esophagitis present Assessment & Plan: Controlled.  Continue Zegerid 40-1100 mg daily.   Overactive bladder Assessment & Plan: Controlled.  No  concerns today. Remain off treatment.   GAD (generalized anxiety disorder) Assessment & Plan: Controlled.  Continue fluoxetine 20 mg daily.   Pain in both lower extremities Assessment & Plan: Controlled.  Continue OTC cream.   Acute pain of left foot Assessment & Plan: Unclear etiology, likely arthritis but need to rule out stress fracture.  Checking uric acid level today. Plain films of left foot ordered and pending. Await results.   Orders: -     Uric acid -     DG Foot Complete Left  Screening for colon cancer -     Ambulatory referral to Gastroenterology  Preventative health care Assessment & Plan: Tetanus due, provided today. Declines Shingrix vaccines. Mammogram UTD at GYN office. Agrees for colonoscopy, referral placed to GI  Discussed the importance of a healthy diet and regular exercise in order for weight loss, and to reduce the risk of further co-morbidity.  Exam stable. Labs pending.  Follow up in 1 year for repeat physical.    Recurrent major depressive disorder, in full remission [facility]) Assessment & Plan: Controlled.  Continue fluoxetine 20 mg daily.         Pleas Koch, NP

## 2022-03-09 NOTE — Assessment & Plan Note (Signed)
Controlled.  Continue OTC cream.

## 2022-03-09 NOTE — Patient Instructions (Addendum)
Stop by the lab and xray prior to leaving today. I will notify you of your results once received.   You will either be contacted via phone regarding your referral to GI for the colonoscopy, or you may receive a letter on your MyChart portal from our referral team with instructions for scheduling an appointment. Please let us know if you have not been contacted by anyone within two weeks.  It was a pleasure to see you today!

## 2022-03-09 NOTE — Assessment & Plan Note (Signed)
Controlled.  Continue Zegerid 40-1100 mg daily.

## 2022-03-09 NOTE — Addendum Note (Signed)
Addended by: Pat Kocher on: 03/09/2022 02:26 PM   Modules accepted: Orders

## 2022-03-09 NOTE — Assessment & Plan Note (Signed)
Unclear etiology, likely arthritis but need to rule out stress fracture.  Checking uric acid level today. Plain films of left foot ordered and pending. Await results.

## 2022-03-09 NOTE — Assessment & Plan Note (Signed)
Tetanus due, provided today. Declines Shingrix vaccines. Mammogram UTD at GYN office. Agrees for colonoscopy, referral placed to GI  Discussed the importance of a healthy diet and regular exercise in order for weight loss, and to reduce the risk of further co-morbidity.  Exam stable. Labs pending.  Follow up in 1 year for repeat physical.

## 2022-03-10 LAB — COMPREHENSIVE METABOLIC PANEL
ALT: 19 U/L (ref 0–35)
AST: 22 U/L (ref 0–37)
Albumin: 4.1 g/dL (ref 3.5–5.2)
Alkaline Phosphatase: 69 U/L (ref 39–117)
BUN: 10 mg/dL (ref 6–23)
CO2: 29 mEq/L (ref 19–32)
Calcium: 9.1 mg/dL (ref 8.4–10.5)
Chloride: 100 mEq/L (ref 96–112)
Creatinine, Ser: 0.7 mg/dL (ref 0.40–1.20)
GFR: 100.99 mL/min (ref >60.00)
Glucose, Bld: 81 mg/dL (ref 70–99)
Potassium: 4.1 mEq/L (ref 3.5–5.1)
Sodium: 138 mEq/L (ref 135–145)
Total Bilirubin: 0.3 mg/dL (ref 0.2–1.2)
Total Protein: 7.1 g/dL (ref 6.0–8.3)

## 2022-03-10 LAB — LIPID PANEL
Cholesterol: 180 mg/dL (ref 0–200)
HDL: 61.4 mg/dL (ref >39.00)
LDL Cholesterol: 100 mg/dL — ABNORMAL HIGH (ref 0–99)
NonHDL: 118.38
Total CHOL/HDL Ratio: 3
Triglycerides: 93 mg/dL (ref 0.0–149.0)
VLDL: 18.6 mg/dL (ref 0.0–40.0)

## 2022-03-10 LAB — URIC ACID: Uric Acid, Serum: 4.2 mg/dL (ref 2.4–7.0)

## 2022-03-12 DIAGNOSIS — M79672 Pain in left foot: Secondary | ICD-10-CM

## 2022-03-16 ENCOUNTER — Encounter: Payer: Self-pay | Admitting: Primary Care

## 2022-03-18 ENCOUNTER — Ambulatory Visit: Payer: BC Managed Care – PPO | Admitting: Podiatry

## 2022-03-18 ENCOUNTER — Encounter: Payer: Self-pay | Admitting: Podiatry

## 2022-03-18 DIAGNOSIS — M7742 Metatarsalgia, left foot: Secondary | ICD-10-CM | POA: Diagnosis not present

## 2022-03-18 DIAGNOSIS — G5762 Lesion of plantar nerve, left lower limb: Secondary | ICD-10-CM

## 2022-03-18 NOTE — Progress Notes (Signed)
  Subjective:  Patient ID: Amanda Ho, female    DOB: 09-12-1970,   MRN: [id number]  Chief Complaint  Patient presents with   Foot Pain    Acute pain of left foot- top of foot near the toes. Started in Dec 2023. Pain is only when using shoes or in the cold weather.     52 y.o. female presents for concern of left foot pain that has been going on for about a month or two. Relates pain on the top of her foot near her toes that worsens in certain shoes and in cold weather.  . Denies any other pedal complaints. Denies n/v/f/c.   Past Medical History:  Diagnosis Date   Anemia    Anxiety    Asthma    rarely with URIs   Colitis 06/01/2019   Family history of anesthesia complication    Vertigo postop   Frequent headaches    GERD (gastroesophageal reflux disease)     Objective:  Physical Exam: Vascular: DP/PT pulses 2/4 bilateral. CFT <3 seconds. Normal hair growth on digits. No edema.  Skin. No lacerations or abrasions bilateral feet.  Musculoskeletal: MMT 5/5 bilateral lower extremities in DF, PF, Inversion and Eversion. Deceased ROM in DF of ankle joint.  Tender mostly over the third interspace and with metatarsal squeeze. Some pain to plantar third and fourth metatarsal heads and pain with PF of the third metatarsal. Positive mulder's click  Neurological: Sensation intact to light touch.   Assessment:   1. Morton's neuroma, left   2. Metatarsalgia, left foot      Plan:  Patient was evaluated and treated and all questions answered. Discussed neuroma and treatment options with patient.  Radiographs reviewed and discussed with patient. No acute fractures or dislocations.  Injection offered today. Patient deferred.  Discussed padding and offloading today. Metatarsal pad provided.  Continue with dual tyelnol ibuprofen pill. Unable to take other anti-inflammatories or steroids.  Discussed if pain does not improve may consider injection vs  MRI for further surgical planning.   Patient to return in 6 weeks or sooner if concerns arise.     Lorenda Peck, DPM

## 2022-03-24 ENCOUNTER — Telehealth: Payer: Self-pay

## 2022-03-24 DIAGNOSIS — F411 Generalized anxiety disorder: Secondary | ICD-10-CM

## 2022-03-24 MED ORDER — FLUOXETINE HCL 20 MG PO CAPS: 20.0000 mg | ORAL_CAPSULE | Freq: Every day | ORAL | 3 refills | Status: DC

## 2022-03-24 NOTE — Telephone Encounter (Signed)
Received refill request for Fluoxetine '20mg'$  capsules

## 2022-03-24 NOTE — Telephone Encounter (Signed)
Requested Prescriptions   Signed Prescriptions Disp Refills   FLUoxetine (PROZAC) 20 MG capsule 90 capsule 3    Sig: Take 1 capsule (20 mg total) by mouth daily. For anxiety and depression.    Authorizing Provider: Pleas Koch   Refill(s) sent to pharmacy.

## 2022-03-24 NOTE — Addendum Note (Signed)
Addended by: Pleas Koch on: 03/24/2022 04:41 PM   Modules accepted: Orders

## 2022-04-29 ENCOUNTER — Ambulatory Visit: Payer: BC Managed Care – PPO | Admitting: Podiatry

## 2022-04-29 ENCOUNTER — Encounter: Payer: Self-pay | Admitting: Podiatry

## 2022-04-29 DIAGNOSIS — G5762 Lesion of plantar nerve, left lower limb: Secondary | ICD-10-CM

## 2022-04-29 DIAGNOSIS — M7742 Metatarsalgia, left foot: Secondary | ICD-10-CM

## 2022-04-29 NOTE — Progress Notes (Signed)
  Subjective:  Patient ID: Amanda Ho, female    DOB: January 03, 1971,   MRN: [id number]  Chief Complaint  Patient presents with   Neuroma     Patient states foot is not getting any better     52 y.o. female presents for continues left foot pain. Relates padding helps a little and gets through the day but still having significant pain. Relates she is afraid to try injection as she has history of ill affects of medicines and steroids. Wanting to discuss surgical options.  . Denies any other pedal complaints. Denies n/v/f/c.   Past Medical History:  Diagnosis Date   Anemia    Anxiety    Asthma    rarely with URIs   Colitis 06/01/2019   Family history of anesthesia complication    Vertigo postop   Frequent headaches    GERD (gastroesophageal reflux disease)     Objective:  Physical Exam: Vascular: DP/PT pulses 2/4 bilateral. CFT <3 seconds. Normal hair growth on digits. No edema.  Skin. No lacerations or abrasions bilateral feet.  Musculoskeletal: MMT 5/5 bilateral lower extremities in DF, PF, Inversion and Eversion. Deceased ROM in DF of ankle joint.  Tender mostly over the third interspace and with metatarsal squeeze. Some pain to plantar third and fourth metatarsal heads and pain with PF of the third metatarsal. Positive mulder's click  Neurological: Sensation intact to light touch.   Assessment:   1. Morton's neuroma, left   2. Metatarsalgia, left foot       Plan:  Patient was evaluated and treated and all questions answered. Discussed neuroma and treatment options with patient.  Radiographs reviewed and discussed with patient. No acute fractures or dislocations.  Injection offered today. Patient deferred. Worried about side affects as has history of responding poorly to steroids.  Continue padding as helping somewhat.  Continue with dual tyelnol ibuprofen pill. Unable to take other anti-inflammatories or steroids.  MRI ordered for surgical planning.  Discussed in  brief surgical options.  Patient to return after MRI.     Lorenda Peck, DPM

## 2022-05-13 ENCOUNTER — Encounter: Payer: Self-pay | Admitting: Podiatry

## 2022-05-22 ENCOUNTER — Ambulatory Visit
Admission: RE | Admit: 2022-05-22 | Discharge: 2022-05-22 | Disposition: A | Discharge status: Home / Self Care | Payer: BC Managed Care – PPO | Source: Ambulatory Visit | Attending: Podiatry | Admitting: Podiatry

## 2022-05-22 DIAGNOSIS — G5762 Lesion of plantar nerve, left lower limb: Secondary | ICD-10-CM

## 2022-05-22 DIAGNOSIS — M7742 Metatarsalgia, left foot: Secondary | ICD-10-CM

## 2022-05-22 DIAGNOSIS — M19072 Primary osteoarthritis, left ankle and foot: Secondary | ICD-10-CM | POA: Diagnosis not present

## 2022-05-27 ENCOUNTER — Encounter: Payer: Self-pay | Admitting: Podiatry

## 2022-05-27 ENCOUNTER — Ambulatory Visit: Payer: BC Managed Care – PPO | Admitting: Podiatry

## 2022-05-27 DIAGNOSIS — M7742 Metatarsalgia, left foot: Secondary | ICD-10-CM

## 2022-05-27 DIAGNOSIS — G5762 Lesion of plantar nerve, left lower limb: Secondary | ICD-10-CM

## 2022-05-27 NOTE — Progress Notes (Signed)
  Subjective:  Patient ID: Amanda Ho, female    DOB: 1971/01/05,   MRN: [id number]  Chief Complaint  Patient presents with   MRI    MRI results    52 y.o. female presents for follow-up of left foot pain and MRI results. Relates still continuing to have pain. Wants to avoid injections. Wanting to discuss surgical options.  . Denies any other pedal complaints. Denies n/v/f/c.   Past Medical History:  Diagnosis Date   Anemia    Anxiety    Asthma    rarely with URIs   Colitis 06/01/2019   Family history of anesthesia complication    Vertigo postop   Frequent headaches    GERD (gastroesophageal reflux disease)     Objective:  Physical Exam: Vascular: DP/PT pulses 2/4 bilateral. CFT <3 seconds. Normal hair growth on digits. No edema.  Skin. No lacerations or abrasions bilateral feet.  Musculoskeletal: MMT 5/5 bilateral lower extremities in DF, PF, Inversion and Eversion. Deceased ROM in DF of ankle joint.  Tender mostly over the third interspace and with metatarsal squeeze. Some pain to plantar third and fourth metatarsal heads and pain with PF of the third metatarsal. Possible mild mulder's click  Neurological: Sensation intact to light touch.   MRI left foot  IMPRESSION: 1. Small volume fluid in the third intermetatarsal space, which may reflect a mild intermetatarsal bursitis. No intermetatarsal space soft tissue mass or evidence of neuroma. 2. Early mild osteoarthritic changes of the forefoot are most notable at the first MTP joint and hallux sesamoid complex. 3. Horizontally-oriented cleft through the midportion of the medial cuneiform favors a bipartite medial cuneiform, an anatomic variant. Remote healed nondisplaced fracture is also a consideration.  Assessment:   1. Morton's neuroma, left   2. Metatarsalgia, left foot        Plan:  Patient was evaluated and treated and all questions answered. Discussed neuroma and treatment options with patient.   Radiographs reviewed and discussed with patient. No acute fractures or dislocations.  Injection offered today. Patient deferred. Worried about side affects as has history of responding poorly to steroids.  MRI reviewed and discussed results wit patient.  Discussed at this point surgical intervention. Discussed neurectomy vs decompression of the nerve and perioperative course in detail with patient and husband. Discussed based on MRI and clinical findings suggest going with nerve decompression and neuroplasty to relieve nerve pain.  -Informed surgical risk consent was reviewed and read aloud to the patient.  I reviewed the films.  I have discussed my findings with the patient in great detail.  I have discussed all risks including but not limited to infection, stiffness, scarring, limp, disability, deformity, damage to blood vessels and nerves, numbness, poor healing, need for braces, arthritis, chronic pain, amputation, death.  All benefits and realistic expectations discussed in great detail.  I have made no promises as to the outcome.  I have provided realistic expectations.  I have offered the patient a 2nd opinion, which they have declined and assured me they preferred to proceed despite the risks. Will plan for surgery 06/08/22.     Louann Sjogren, DPM

## 2022-05-28 ENCOUNTER — Telehealth: Payer: Self-pay | Admitting: Urology

## 2022-05-28 NOTE — Telephone Encounter (Signed)
DOS - 06/08/22  NEUROPLASTY LEFT --- 84132  [employer] - 05/17/22   DEDUCTIBLE - $4,000.00 W/ $4,000.00 REMAINING OOP - $9,450.00 W/ $9,450.00 REMAINING COINSURANCE - 30%   SPOKE WITH JALISA WITH [employer] AND SHE STATED THAT FOR CPT CODE 44010 NO PRIOR AUTH IS REQUIRED.   CALL REF # [id number]. 05/28/22 AT 10:55 AM EST

## 2022-06-08 ENCOUNTER — Other Ambulatory Visit: Payer: Self-pay | Admitting: Podiatry

## 2022-06-08 DIAGNOSIS — G5762 Lesion of plantar nerve, left lower limb: Secondary | ICD-10-CM | POA: Diagnosis not present

## 2022-06-08 MED ORDER — ONDANSETRON HCL 4 MG PO TABS: 4.0000 mg | ORAL_TABLET | Freq: Three times a day (TID) | ORAL | 0 refills | Status: DC | PRN

## 2022-06-08 MED ORDER — OXYCODONE-ACETAMINOPHEN 5-325 MG PO TABS: 1.0000 | ORAL_TABLET | Freq: Four times a day (QID) | ORAL | 0 refills | Status: AC | PRN

## 2022-06-15 ENCOUNTER — Encounter: Payer: Self-pay | Admitting: Podiatry

## 2022-06-17 ENCOUNTER — Encounter: Payer: Self-pay | Admitting: Podiatry

## 2022-06-17 ENCOUNTER — Ambulatory Visit (INDEPENDENT_AMBULATORY_CARE_PROVIDER_SITE_OTHER): Payer: BC Managed Care – PPO | Admitting: Podiatry

## 2022-06-17 DIAGNOSIS — Z9889 Other specified postprocedural states: Secondary | ICD-10-CM

## 2022-06-17 NOTE — Progress Notes (Signed)
  Subjective:  Patient ID: Amanda Ho, female    DOB: 02/11/1971,  MRN: [id number]  No chief complaint on file.   DOS: 06/08/22  Procedure: Left morton's neuroma neuroplasty   52 y.o. female returns for POV#1. Relates doing well with minimal pain.   Review of Systems: Negative except as noted in the HPI. Denies N/V/F/Ch.  Past Medical History:  Diagnosis Date   Anemia    Anxiety    Asthma    rarely with URIs   Colitis 06/01/2019   Family history of anesthesia complication    Vertigo postop   Frequent headaches    GERD (gastroesophageal reflux disease)     Current Outpatient Medications:    acetaminophen (TYLENOL) 325 MG tablet, Take 650 mg by mouth every 6 (six) hours as needed for mild pain or headache., Disp: , Rfl:    diphenhydramine-acetaminophen (TYLENOL PM) 25-500 MG TABS tablet, Take 0.5 tablets by mouth at bedtime as needed (pain/sleep)., Disp: , Rfl:    FLUoxetine (PROZAC) 20 MG capsule, Take 1 capsule (20 mg total) by mouth daily. For anxiety and depression., Disp: 90 capsule, Rfl: 3   omeprazole-sodium bicarbonate (ZEGERID) 40-1100 MG per capsule, Take 1 capsule by mouth daily before breakfast., Disp: , Rfl:    ondansetron (ZOFRAN) 4 MG tablet, Take 1 tablet (4 mg total) by mouth every 8 (eight) hours as needed for nausea or vomiting., Disp: 20 tablet, Rfl: 0  Social History   Tobacco Use  Smoking Status Never  Smokeless Tobacco Never    Allergies  Allergen Reactions   Augmentin [Amoxicillin-Pot Clavulanate] Nausea And Vomiting   Doxycycline Nausea And Vomiting   Erythromycin Nausea And Vomiting   Etodolac Other (See Comments)    Dizziness   Flagyl [Metronidazole] Nausea And Vomiting    Both oral and cream    Hydrocodone-Acetaminophen Nausea And Vomiting   Ibuprofen Nausea And Vomiting   Morphine And Related Other (See Comments)    Sleeps hard, can't wake up   Prednisone Nausea And Vomiting   Sulfa Antibiotics Nausea And Vomiting   Talwin  [Pentazocine] Nausea And Vomiting   Sudafed [Pseudoephedrine Hcl] Palpitations    Tachycardia   Objective:  There were no vitals filed for this visit. There is no height or weight on file to calculate BMI. Constitutional Well developed. Well nourished.  Vascular Foot warm and well perfused. Capillary refill normal to all digits.   Neurologic Normal speech. Oriented to person, place, and time. Epicritic sensation to light touch grossly present bilaterally.  Dermatologic Skin healing well without signs of infection. Skin edges well coapted without signs of infection.  Orthopedic: Tenderness to palpation noted about the surgical site.   Assessment:  No diagnosis found. Plan:  Patient was evaluated and treated and all questions answered.  S/p foot surgery left -Progressing as expected post-operatively. -WB Status: WBAT in surgical shoe -Sutures: intact. -Medications: n/a -Foot redressed.  Return in 2 weeks for suture removal.   No follow-ups on file.

## 2022-07-01 ENCOUNTER — Encounter: Payer: BC Managed Care – PPO | Admitting: Podiatry

## 2022-07-02 ENCOUNTER — Ambulatory Visit (INDEPENDENT_AMBULATORY_CARE_PROVIDER_SITE_OTHER): Payer: BC Managed Care – PPO | Admitting: Podiatry

## 2022-07-02 ENCOUNTER — Encounter: Payer: Self-pay | Admitting: Podiatry

## 2022-07-02 DIAGNOSIS — G5762 Lesion of plantar nerve, left lower limb: Secondary | ICD-10-CM

## 2022-07-02 DIAGNOSIS — Z9889 Other specified postprocedural states: Secondary | ICD-10-CM

## 2022-07-02 NOTE — Progress Notes (Signed)
  Subjective:  Patient ID: Amanda Ho, female    DOB: 07-05-1970,  MRN: [id number]  Chief Complaint  Patient presents with   Routine Post Op    POV #2 DOS 06/08/2022 LT MORTONS NEUROMA NEUROPLASTY    DOS: 06/08/22  Procedure: Left morton's neuroma neuroplasty   52 y.o. female returns for POV#2. Relates doing well with minimal pain.   Review of Systems: Negative except as noted in the HPI. Denies N/V/F/Ch.  Past Medical History:  Diagnosis Date   Anemia    Anxiety    Asthma    rarely with URIs   Colitis 06/01/2019   Family history of anesthesia complication    Vertigo postop   Frequent headaches    GERD (gastroesophageal reflux disease)     Current Outpatient Medications:    acetaminophen (TYLENOL) 325 MG tablet, Take 650 mg by mouth every 6 (six) hours as needed for mild pain or headache., Disp: , Rfl:    diphenhydramine-acetaminophen (TYLENOL PM) 25-500 MG TABS tablet, Take 0.5 tablets by mouth at bedtime as needed (pain/sleep)., Disp: , Rfl:    FLUoxetine (PROZAC) 20 MG capsule, Take 1 capsule (20 mg total) by mouth daily. For anxiety and depression., Disp: 90 capsule, Rfl: 3   omeprazole-sodium bicarbonate (ZEGERID) 40-1100 MG per capsule, Take 1 capsule by mouth daily before breakfast., Disp: , Rfl:    ondansetron (ZOFRAN) 4 MG tablet, Take 1 tablet (4 mg total) by mouth every 8 (eight) hours as needed for nausea or vomiting., Disp: 20 tablet, Rfl: 0  Social History   Tobacco Use  Smoking Status Never  Smokeless Tobacco Never    Allergies  Allergen Reactions   Augmentin [Amoxicillin-Pot Clavulanate] Nausea And Vomiting   Doxycycline Nausea And Vomiting   Erythromycin Nausea And Vomiting   Etodolac Other (See Comments)    Dizziness   Flagyl [Metronidazole] Nausea And Vomiting    Both oral and cream    Hydrocodone-Acetaminophen Nausea And Vomiting   Ibuprofen Nausea And Vomiting   Morphine And Codeine Other (See Comments)    Sleeps hard, can't wake up    Prednisone Nausea And Vomiting   Sulfa Antibiotics Nausea And Vomiting   Talwin [Pentazocine] Nausea And Vomiting   Sudafed [Pseudoephedrine Hcl] Palpitations    Tachycardia   Objective:  There were no vitals filed for this visit. There is no height or weight on file to calculate BMI. Constitutional Well developed. Well nourished.  Vascular Foot warm and well perfused. Capillary refill normal to all digits.   Neurologic Normal speech. Oriented to person, place, and time. Epicritic sensation to light touch grossly present bilaterally.  Dermatologic Skin healing well without signs of infection. Skin edges well coapted without signs of infection.  Orthopedic: Tenderness to palpation noted about the surgical site.   Assessment:   1. Post-operative state   2. Morton's neuroma, left    Plan:  Patient was evaluated and treated and all questions answered.  S/p foot surgery left -Progressing as expected post-operatively. -WB Status: WBAT in surgical shoe -Sutures: removed without incident.  -Medications: n/a -Foot redressed.  Return in 3 weeks for recheck.   Return in about 3 weeks (around 07/23/2022) for post op.

## 2022-07-23 ENCOUNTER — Ambulatory Visit (INDEPENDENT_AMBULATORY_CARE_PROVIDER_SITE_OTHER): Payer: BC Managed Care – PPO | Admitting: Podiatry

## 2022-07-23 ENCOUNTER — Encounter: Payer: Self-pay | Admitting: Podiatry

## 2022-07-23 DIAGNOSIS — Z9889 Other specified postprocedural states: Secondary | ICD-10-CM

## 2022-07-23 NOTE — Progress Notes (Signed)
  Subjective:  Patient ID: Amanda Ho, female    DOB: April 11, 1970,  MRN: [id number]  Chief Complaint  Patient presents with   Routine Post Op    DOS: 06/08/22  Procedure: Left morton's neuroma neuroplasty   52 y.o. female returns for POV#3. Relates doing well with minimal pain.   Review of Systems: Negative except as noted in the HPI. Denies N/V/F/Ch.  Past Medical History:  Diagnosis Date   Anemia    Anxiety    Asthma    rarely with URIs   Colitis 06/01/2019   Family history of anesthesia complication    Vertigo postop   Frequent headaches    GERD (gastroesophageal reflux disease)     Current Outpatient Medications:    acetaminophen (TYLENOL) 325 MG tablet, Take 650 mg by mouth every 6 (six) hours as needed for mild pain or headache., Disp: , Rfl:    diphenhydramine-acetaminophen (TYLENOL PM) 25-500 MG TABS tablet, Take 0.5 tablets by mouth at bedtime as needed (pain/sleep)., Disp: , Rfl:    FLUoxetine (PROZAC) 20 MG capsule, Take 1 capsule (20 mg total) by mouth daily. For anxiety and depression., Disp: 90 capsule, Rfl: 3   omeprazole-sodium bicarbonate (ZEGERID) 40-1100 MG per capsule, Take 1 capsule by mouth daily before breakfast., Disp: , Rfl:    ondansetron (ZOFRAN) 4 MG tablet, Take 1 tablet (4 mg total) by mouth every 8 (eight) hours as needed for nausea or vomiting., Disp: 20 tablet, Rfl: 0  Social History   Tobacco Use  Smoking Status Never  Smokeless Tobacco Never    Allergies  Allergen Reactions   Augmentin [Amoxicillin-Pot Clavulanate] Nausea And Vomiting   Doxycycline Nausea And Vomiting   Erythromycin Nausea And Vomiting   Etodolac Other (See Comments)    Dizziness   Flagyl [Metronidazole] Nausea And Vomiting    Both oral and cream    Hydrocodone-Acetaminophen Nausea And Vomiting   Ibuprofen Nausea And Vomiting   Morphine And Codeine Other (See Comments)    Sleeps hard, can't wake up   Prednisone Nausea And Vomiting   Sulfa Antibiotics  Nausea And Vomiting   Talwin [Pentazocine] Nausea And Vomiting   Sudafed [Pseudoephedrine Hcl] Palpitations    Tachycardia   Objective:  There were no vitals filed for this visit. There is no height or weight on file to calculate BMI. Constitutional Well developed. Well nourished.  Vascular Foot warm and well perfused. Capillary refill normal to all digits.   Neurologic Normal speech. Oriented to person, place, and time. Epicritic sensation to light touch grossly present bilaterally.  Dermatologic Skin healing well without signs of infection. Skin edges well coapted without signs of infection.  Orthopedic: Tenderness to palpation noted about the surgical site.   Assessment:   1. Post-operative state     Plan:  Patient was evaluated and treated and all questions answered.  S/p foot surgery left -Progressing as expected post-operatively. -WB Status: WBAT in regular shoe.  -Medications: n/a   Patient discharged from surgical standpoint.   Return if symptoms worsen or fail to improve.

## 2022-09-22 DIAGNOSIS — Z01419 Encounter for gynecological examination (general) (routine) without abnormal findings: Secondary | ICD-10-CM | POA: Diagnosis not present

## 2022-09-22 DIAGNOSIS — Z1231 Encounter for screening mammogram for malignant neoplasm of breast: Secondary | ICD-10-CM | POA: Diagnosis not present

## 2022-09-22 DIAGNOSIS — Z6829 Body mass index (BMI) 29.0-29.9, adult: Secondary | ICD-10-CM | POA: Diagnosis not present

## 2022-11-23 DIAGNOSIS — B349 Viral infection, unspecified: Secondary | ICD-10-CM | POA: Diagnosis not present

## 2022-11-23 DIAGNOSIS — B370 Candidal stomatitis: Secondary | ICD-10-CM | POA: Diagnosis not present

## 2022-11-23 DIAGNOSIS — Z20822 Contact with and (suspected) exposure to covid-19: Secondary | ICD-10-CM | POA: Diagnosis not present

## 2022-11-23 DIAGNOSIS — R52 Pain, unspecified: Secondary | ICD-10-CM | POA: Diagnosis not present

## 2022-11-25 ENCOUNTER — Encounter (HOSPITAL_BASED_OUTPATIENT_CLINIC_OR_DEPARTMENT_OTHER): Payer: Self-pay

## 2022-11-25 ENCOUNTER — Other Ambulatory Visit: Payer: Self-pay

## 2022-11-25 ENCOUNTER — Ambulatory Visit
Admission: RE | Admit: 2022-11-25 | Discharge: 2022-11-25 | Disposition: A | Discharge status: Home / Self Care | Payer: BC Managed Care – PPO | Source: Ambulatory Visit

## 2022-11-25 ENCOUNTER — Emergency Department (HOSPITAL_BASED_OUTPATIENT_CLINIC_OR_DEPARTMENT_OTHER)
Admission: EM | Admit: 2022-11-25 | Discharge: 2022-11-25 | Disposition: A | Discharge status: Home / Self Care | Payer: BC Managed Care – PPO | Attending: Emergency Medicine | Admitting: Emergency Medicine

## 2022-11-25 VITALS — BP 130/83 | HR 102 | Temp 98.5°F | Resp 17

## 2022-11-25 DIAGNOSIS — G510 Bell's palsy: Secondary | ICD-10-CM | POA: Insufficient documentation

## 2022-11-25 DIAGNOSIS — Q670 Congenital facial asymmetry: Secondary | ICD-10-CM | POA: Diagnosis not present

## 2022-11-25 DIAGNOSIS — R29810 Facial weakness: Secondary | ICD-10-CM | POA: Diagnosis not present

## 2022-11-25 MED ORDER — VALACYCLOVIR HCL 1 G PO TABS: 1000.0000 mg | ORAL_TABLET | Freq: Three times a day (TID) | ORAL | 0 refills | Status: DC

## 2022-11-25 NOTE — ED Notes (Signed)
Patient is being discharged from the Urgent Care and sent to the Emergency Department via POV driven by husband. Per Trevor Iha FNP, patient is in need of higher level of care due to facial drooping and possible need for advanced imaging. Patient is aware and verbalizes understanding of plan of care.  Vitals:   11/25/22 1905  BP: 130/83  Pulse: (!) 102  Resp: 17  Temp: 98.5 F (36.9 C)  SpO2: 97%

## 2022-11-25 NOTE — Discharge Instructions (Signed)
Please follow-up with the eye doctor and the neurologist.  Please return for one-sided numbness or weakness or difficulty with speech or swallowing.  Tape your eye shut at night.  Using preservative-free lubricating eyedrop.

## 2022-11-25 NOTE — ED Triage Notes (Addendum)
Pt c/o bilateral eye problem/burning since Tues. Also LT side of face/eye is numb/drooping. Denies extremity weakness. Was seen in UC on Tues for viral sxs, dx with thrush. Taking nystatin QID. COVID and flu neg. OTC eye drops prn.

## 2022-11-25 NOTE — ED Provider Notes (Signed)
[facility]    CSN: [id number] Arrival date & time: 11/25/22  1853      History   Chief Complaint Chief Complaint  Patient presents with   Eye Problem    Bilateral    HPI Brailey Knickerbocker is a 52 y.o. female.   HPI Pleasant 52 year old female presents with possible thrush and is now concerned with eye irritation and uncontrolled right facial movements that have been intermittent since Tuesday of this week.  PMH significant for colitis, frequent headache, and panic disorder.  Past Medical History:  Diagnosis Date   Anemia    Anxiety    Asthma    rarely with URIs   Colitis 06/01/2019   Family history of anesthesia complication    Vertigo postop   Frequent headaches    GERD (gastroesophageal reflux disease)     Patient Active Problem List   Diagnosis Date Noted   Acute pain of left foot 03/09/2022   Lower extremity pain 10/12/2019   Vitamin D deficiency 10/12/2019   Overactive bladder 10/12/2019   Encounter for annual general medical examination with abnormal findings in adult 02/21/2017   GAD (generalized anxiety disorder) 11/05/2015   GERD (gastroesophageal reflux disease) 11/05/2015   Panic disorder 03/25/2015   Recurrent major depressive disorder, in full remission (HCC) 03/25/2015    Past Surgical History:  Procedure Laterality Date   ABDOMINAL HYSTERECTOMY  2013   CESAREAN SECTION     x2    LAPAROSCOPIC ASSISTED VAGINAL HYSTERECTOMY  01/12/2012   Procedure: LAPAROSCOPIC ASSISTED VAGINAL HYSTERECTOMY;  Surgeon: Turner Daniels, MD;  Location: [facility];  Service: Gynecology;  Laterality: N/A;   TUBAL LIGATION      OB History   No obstetric history on file.      Home Medications    Prior to Admission medications   Medication Sig Start Date End Date Taking? Authorizing Provider  nystatin (MYCOSTATIN) 500000 units TABS tablet Take 500,000 Units by mouth 4 (four) times daily as needed (QID).   Yes [provider]  acetaminophen  (TYLENOL) 325 MG tablet Take 650 mg by mouth every 6 (six) hours as needed for mild pain or headache.    [provider]  diphenhydramine-acetaminophen (TYLENOL PM) 25-500 MG TABS tablet Take 0.5 tablets by mouth at bedtime as needed (pain/sleep).    [provider]  FLUoxetine (PROZAC) 20 MG capsule Take 1 capsule (20 mg total) by mouth daily. For anxiety and depression. 03/24/22   Doreene Nest, NP  omeprazole-sodium bicarbonate (ZEGERID) 40-1100 MG per capsule Take 1 capsule by mouth daily before breakfast.    [provider]  ondansetron (ZOFRAN) 4 MG tablet Take 1 tablet (4 mg total) by mouth every 8 (eight) hours as needed for nausea or vomiting. 06/08/22   Louann Sjogren, DPM    Family History Family History  Problem Relation Age of Onset   Arthritis Mother    Breast cancer Mother    Mental illness Mother    Mental illness Father    Stroke Father    Liver disease Father        NAFLD   Bone cancer Father    Liver disease Sister        NAFLD   Hypertension Brother    Breast cancer Maternal Grandmother    Heart disease Maternal Grandmother    Hypertension Maternal Grandmother    Alcohol abuse Maternal Grandfather    Parkinson's disease Paternal Aunt     Social History Social History  Tobacco Use   Smoking status: Never   Smokeless tobacco: Never  Substance Use Topics   Alcohol use: Yes    Alcohol/week: 1.0 standard drink of alcohol    Types: 1 Glasses of wine per week    Comment: socially   Drug use: No     Allergies   Augmentin [amoxicillin-pot clavulanate], Doxycycline, Erythromycin, Etodolac, Flagyl [metronidazole], Hydrocodone-acetaminophen, Ibuprofen, Morphine and codeine, Prednisone, Sulfa antibiotics, Talwin [pentazocine], and Sudafed [pseudoephedrine hcl]   Review of Systems Review of Systems   Physical Exam Triage Vital Signs ED Triage Vitals  Encounter Vitals Group     BP      Systolic BP Percentile      Diastolic  BP Percentile      Pulse      Resp      Temp      Temp src      SpO2      Weight      Height      Head Circumference      Peak Flow      Pain Score      Pain Loc      Pain Education      Exclude from Growth Chart    No data found.  Updated Vital Signs BP 130/83 (BP Location: Left Arm)   Pulse (!) 102   Temp 98.5 F (36.9 C) (Oral)   Resp 17   LMP 12/21/2011   SpO2 97%    Physical Exam Vitals and nursing note reviewed.  Constitutional:      General: She is not in acute distress.    Appearance: Normal appearance. She is obese. She is not ill-appearing.     Comments: Slight left facial asymmetry noted on exam, patient reports uncontrollable eye blinking on the right side affects  HENT:     Head: Normocephalic and atraumatic.     Mouth/Throat:     Mouth: Mucous membranes are moist.     Pharynx: Oropharynx is clear.  Eyes:     Extraocular Movements: Extraocular movements intact.     Conjunctiva/sclera: Conjunctivae normal.     Pupils: Pupils are equal, round, and reactive to light.  Cardiovascular:     Rate and Rhythm: Normal rate and regular rhythm.     Pulses: Normal pulses.     Heart sounds: Normal heart sounds.  Pulmonary:     Effort: Pulmonary effort is normal.     Breath sounds: Normal breath sounds. No wheezing, rhonchi or rales.  Musculoskeletal:        General: Normal range of motion.     Cervical back: Normal range of motion and neck supple.  Skin:    General: Skin is warm and dry.  Neurological:     General: No focal deficit present.     Mental Status: She is alert and oriented to person, place, and time. Mental status is at baseline.     Cranial Nerves: No cranial nerve deficit.     Sensory: No sensory deficit.     Motor: No weakness.     Coordination: Coordination normal.     Gait: Gait normal.  Psychiatric:        Mood and Affect: Mood normal.        Behavior: Behavior normal.        Thought Content: Thought content normal.      UC  Treatments / Results  Labs (all labs ordered are listed, but only abnormal results are displayed) Labs Reviewed - No  data to display  EKG   Radiology No results found.  Procedures Procedures (including critical care time)  Medications Ordered in UC Medications - No data to display  Initial Impression / Assessment and Plan / UC Course  I have reviewed the triage vital signs and the nursing notes.  Pertinent labs & imaging results that were available during my care of the patient were reviewed by me and considered in my medical decision making (see chart for details).     MDM: 1.  Facial asymmetry-Advised patient to go to [facility] ED now for further evaluation to include imaging of head.  Patient/husband agreed and verbalized understanding of these instructions and this plan of care today. Final Clinical Impressions(s) / UC Diagnoses   Final diagnoses:  Facial asymmetry     Discharge Instructions      Advised patient to go to [facility] ED now for further evaluation to include imaging of head.     ED Prescriptions   None    PDMP not reviewed this encounter.   Trevor Iha, FNP 11/25/22 [phone]

## 2022-11-25 NOTE — ED Notes (Signed)
Pt has slight droop in the L side of her face,

## 2022-11-25 NOTE — ED Triage Notes (Addendum)
Pt was seen at [facility], treated for Thrush and sent here for further eval. Pt states she has to concentrate to close left eye and slight droop to left lip.   No weakness in extremities or slurred speech

## 2022-11-25 NOTE — Discharge Instructions (Addendum)
Advised patient to go to [facility] med [facility] ED now for further evaluation to include imaging of head.

## 2022-11-25 NOTE — ED Provider Notes (Signed)
[place] [facility] [place] Provider Note   CSN: [id number] Arrival date & time: 11/25/22  1940     History  Chief Complaint  Patient presents with   ?Bell's Palsy    Amanda Ho is a 52 y.o. female.  52 yo F with a chief complaints of left-sided facial weakness.  The patient had been sick this week.  She went to urgent care and was diagnosed with a viral syndrome and thrush.  She feels like that has gotten a bit better but she noticed about 2 days ago that she started having some left facial weakness.  She went to urgent care today and they were concerned about her symptoms and sent her here for evaluation.        Home Medications Prior to Admission medications   Medication Sig Start Date End Date Taking? Authorizing Provider  valACYclovir (VALTREX) 1000 MG tablet Take 1 tablet (1,000 mg total) by mouth 3 (three) times daily. 11/25/22  Yes Melene Plan, DO  acetaminophen (TYLENOL) 325 MG tablet Take 650 mg by mouth every 6 (six) hours as needed for mild pain or headache.    [provider]  diphenhydramine-acetaminophen (TYLENOL PM) 25-500 MG TABS tablet Take 0.5 tablets by mouth at bedtime as needed (pain/sleep).    [provider]  FLUoxetine (PROZAC) 20 MG capsule Take 1 capsule (20 mg total) by mouth daily. For anxiety and depression. 03/24/22   Doreene Nest, NP  nystatin (MYCOSTATIN) 500000 units TABS tablet Take 500,000 Units by mouth 4 (four) times daily as needed (QID).    [provider]  omeprazole-sodium bicarbonate (ZEGERID) 40-1100 MG per capsule Take 1 capsule by mouth daily before breakfast.    [provider]  ondansetron (ZOFRAN) 4 MG tablet Take 1 tablet (4 mg total) by mouth every 8 (eight) hours as needed for nausea or vomiting. 06/08/22   Louann Sjogren, DPM      Allergies    Augmentin [amoxicillin-pot clavulanate], Doxycycline, Erythromycin, Etodolac, Flagyl [metronidazole],  Hydrocodone-acetaminophen, Ibuprofen, Morphine and codeine, Prednisone, Sulfa antibiotics, Talwin [pentazocine], and Sudafed [pseudoephedrine hcl]    Review of Systems   Review of Systems  Physical Exam Updated Vital Signs BP (!) 157/89 (BP Location: Left Arm)   Pulse 77   Temp 98.5 F (36.9 C) (Oral)   Resp 16   Ht 5\' 3"  (1.6 m)   Wt 77.1 kg   LMP 12/21/2011   SpO2 97%   BMI 30.11 kg/m  Physical Exam Vitals and nursing note reviewed.  Constitutional:      General: She is not in acute distress.    Appearance: She is well-developed. She is not diaphoretic.  HENT:     Head: Normocephalic and atraumatic.  Eyes:     Pupils: Pupils are equal, round, and reactive to light.  Cardiovascular:     Rate and Rhythm: Normal rate and regular rhythm.     Heart sounds: No murmur heard.    No friction rub. No gallop.  Pulmonary:     Effort: Pulmonary effort is normal.     Breath sounds: No wheezing or rales.  Abdominal:     General: There is no distension.     Palpations: Abdomen is soft.     Tenderness: There is no abdominal tenderness.  Musculoskeletal:        General: No tenderness.     Cervical back: Normal range of motion and neck supple.  Skin:    General: Skin is warm  and dry.  Neurological:     Mental Status: She is alert and oriented to person, place, and time.     Comments: Left-sided facial weakness involving the forehead.  Otherwise benign neurologic exam.    Psychiatric:        Behavior: Behavior normal.     ED Results / Procedures / Treatments   Labs (all labs ordered are listed, but only abnormal results are displayed) Labs Reviewed - No data to display  EKG None  Radiology No results found.  Procedures Procedures    Medications Ordered in ED Medications - No data to display  ED Course/ Medical Decision Making/ A&P                                 Medical Decision Making Risk Prescription drug management.   52 yo F with a chief complaints  of left-sided facial weakness.  This has been going on for about 48 hours.  Patient had what sounds ago viral syndrome preceding had had congestion nausea myalgias.  Was diagnosed with thrush at an urgent care center and was started on medication for this.  She went back to urgent care today to be evaluated and they were concerned about her facial weakness and sent her here for evaluation.  Clinically the patient has Bell palsy.  She has a benign neurologic exam otherwise.  She has had an intolerance to prednisone before, I discussed with her the recent research that suggest that prednisone likely is not helpful in this condition.  Will start on valacyclovir.  Given follow-up with ophthalmology and neurology.  8:10 PM:  I have discussed the diagnosis/risks/treatment options with the patient.  Evaluation and diagnostic testing in the emergency department does not suggest an emergent condition requiring admission or immediate intervention beyond what has been performed at this time.  They will follow up with PCP. We also discussed returning to the ED immediately if new or worsening sx occur. We discussed the sx which are most concerning (e.g., sudden worsening pain, fever, inability to tolerate by mouth) that necessitate immediate return. Medications administered to the patient during their visit and any new prescriptions provided to the patient are listed below.  Medications given during this visit Medications - No data to display   The patient appears reasonably screen and/or stabilized for discharge and I doubt any other medical condition or other [facility] requiring further screening, evaluation, or treatment in the ED at this time prior to discharge.          Final Clinical Impression(s) / ED Diagnoses Final diagnoses:  Bell palsy    Rx / DC Orders ED Discharge Orders          Ordered    valACYclovir (VALTREX) 1000 MG tablet  3 times daily        11/25/22 2006    Ambulatory referral to  Neurology       Comments: Bell palsy   11/25/22 2007              Melene Plan, DO 11/25/22 2010

## 2022-11-26 DIAGNOSIS — G510 Bell's palsy: Secondary | ICD-10-CM | POA: Diagnosis not present

## 2022-11-26 DIAGNOSIS — H2513 Age-related nuclear cataract, bilateral: Secondary | ICD-10-CM | POA: Diagnosis not present

## 2022-11-26 DIAGNOSIS — H0220B Unspecified lagophthalmos left eye, upper and lower eyelids: Secondary | ICD-10-CM | POA: Diagnosis not present

## 2022-11-29 ENCOUNTER — Encounter: Payer: Self-pay | Admitting: Family Medicine

## 2022-11-29 ENCOUNTER — Ambulatory Visit: Payer: BC Managed Care – PPO | Admitting: Family Medicine

## 2022-11-29 VITALS — BP 126/68 | HR 90 | Temp 98.4°F | Ht 63.0 in | Wt 168.5 lb

## 2022-11-29 DIAGNOSIS — B370 Candidal stomatitis: Secondary | ICD-10-CM

## 2022-11-29 DIAGNOSIS — G510 Bell's palsy: Secondary | ICD-10-CM | POA: Insufficient documentation

## 2022-11-29 HISTORY — DX: Candidal stomatitis: B37.0

## 2022-11-29 NOTE — Patient Instructions (Addendum)
Hold the valcylovir for now  If your Bells palsy symptoms worsen or change let us know   Get rest when you can  Take care of yourself  Keep up fluids    See the eye doctor as planned    Continue treating the thrush (your mouth looks good)

## 2022-11-29 NOTE — Assessment & Plan Note (Addendum)
This developed s/p GI virus  First seen at [facility] then ER ([facility], then urgent care at [facility], then ER)  Reviewed hospital records, lab results and studies in detail   Reassuring work up  Reassuring exam today- her facial paralysis is mild and has started to improve  Unfortunately pt is intol to steroids and declined them Also started valcyclovir 4 d ago and is not tolerating (nausea/diarrhea/ malaise) Pt thinks the side effects are worse than the bells palsy symptoms   After discussion of pros/cons-instructed to hold the valcyclovir and monitor closely  Seeing oph-has follow up tomorrow Had ref to neuro- unsure if she will need to go  Will monitor and update based on further improvement  Update if not starting to improve in a week or if worsening  Call back and Er precautions noted in detail today    Will cc her pcp

## 2022-11-29 NOTE — Progress Notes (Signed)
Subjective:    Patient ID: Amanda Ho, female    DOB: 1971/02/09, 52 y.o.   MRN: [id number]  HPI  Wt Readings from Last 3 Encounters:  11/29/22 168 lb 8 oz (76.4 kg)  11/25/22 170 lb (77.1 kg)  03/09/22 173 lb (78.5 kg)   29.85 kg/m  Vitals:   11/29/22 1156  BP: 126/68  Pulse: 90  Temp: 98.4 F (36.9 C)  SpO2: 96%    51 yo pt of NP Clark here for Bells palsy   She was seen in ER on 10/10   (after being seen at [facility] and then [place])  for GI virus- neg testing for covid and flu)  Presented with left sided facial weakness  (after a viral syndrome with thrush)  Per chart has used zofran in past for nausea  Caught GI virus from husband He did not get as sick She has trouble tolerating medications-usually GI   Treated with valcyclovir 1000 mg tid for 7 d  Not tolerating well - nausea and diarrhea  A little back pain  Achey   A little easier to blink her eye today  Less sensitive to light  More of this when fatigued or in the afternoon   She is taping her eye closed at night  Using gel eye lubricant also   No rash at all  Had some left ear pain- this is improving   Eye doctor appointment tomorrow  Getting set up with neuro  Plans to go back to work Wednesday     Past intol to prednisone -declined staroids    Patient Active Problem List   Diagnosis Date Noted   Bell's palsy 11/29/2022   Thrush 11/29/2022   Acute pain of left foot 03/09/2022   Lower extremity pain 10/12/2019   Vitamin D deficiency 10/12/2019   Overactive bladder 10/12/2019   Encounter for annual general medical examination with abnormal findings in adult 02/21/2017   GAD (generalized anxiety disorder) 11/05/2015   GERD (gastroesophageal reflux disease) 11/05/2015   Panic disorder 03/25/2015   Recurrent major depressive disorder, in full remission (HCC) 03/25/2015   Past Medical History:  Diagnosis Date   Anemia    Anxiety    Asthma    rarely with URIs    Colitis 06/01/2019   Family history of anesthesia complication    Vertigo postop   Frequent headaches    GERD (gastroesophageal reflux disease)    Past Surgical History:  Procedure Laterality Date   ABDOMINAL HYSTERECTOMY  2013   CESAREAN SECTION     x2    LAPAROSCOPIC ASSISTED VAGINAL HYSTERECTOMY  01/12/2012   Procedure: LAPAROSCOPIC ASSISTED VAGINAL HYSTERECTOMY;  Surgeon: Turner Daniels, MD;  Location: [facility];  Service: Gynecology;  Laterality: N/A;   TUBAL LIGATION     Social History   Tobacco Use   Smoking status: Never   Smokeless tobacco: Never  Substance Use Topics   Alcohol use: Yes    Alcohol/week: 1.0 standard drink of alcohol    Types: 1 Glasses of wine per week    Comment: socially   Drug use: No   Family History  Problem Relation Age of Onset   Arthritis Mother    Breast cancer Mother    Mental illness Mother    Mental illness Father    Stroke Father    Liver disease Father        NAFLD   Bone cancer Father    Liver disease Sister  NAFLD   Hypertension Brother    Breast cancer Maternal Grandmother    Heart disease Maternal Grandmother    Hypertension Maternal Grandmother    Alcohol abuse Maternal Grandfather    Parkinson's disease Paternal Aunt    Allergies  Allergen Reactions   Lactase-Lactobacillus Diarrhea   Augmentin [Amoxicillin-Pot Clavulanate] Nausea And Vomiting   Doxycycline Nausea And Vomiting   Erythromycin Nausea And Vomiting   Etodolac Other (See Comments)    Dizziness   Flagyl [Metronidazole] Nausea And Vomiting    Both oral and cream    Gatifloxacin Other (See Comments)   Hydrocodone-Acetaminophen Nausea And Vomiting   Ibuprofen Nausea And Vomiting   Morphine And Codeine Other (See Comments)    Sleeps hard, can't wake up   Prednisone Nausea And Vomiting   Sulfa Antibiotics Nausea And Vomiting   Talwin [Pentazocine] Nausea And Vomiting   Sudafed [Pseudoephedrine Hcl] Palpitations    Tachycardia   Current Outpatient  Medications on File Prior to Visit  Medication Sig Dispense Refill   acetaminophen (TYLENOL) 325 MG tablet Take 650 mg by mouth every 6 (six) hours as needed for mild pain or headache.     diphenhydramine-acetaminophen (TYLENOL PM) 25-500 MG TABS tablet Take 0.5 tablets by mouth at bedtime as needed (pain/sleep).     FLUoxetine (PROZAC) 20 MG capsule Take 1 capsule (20 mg total) by mouth daily. For anxiety and depression. 90 capsule 3   nystatin (MYCOSTATIN) 500000 units TABS tablet Take 500,000 Units by mouth 4 (four) times daily as needed (QID).     omeprazole-sodium bicarbonate (ZEGERID) 40-1100 MG per capsule Take 1 capsule by mouth daily before breakfast.     ondansetron (ZOFRAN) 4 MG tablet Take 1 tablet (4 mg total) by mouth every 8 (eight) hours as needed for nausea or vomiting. 20 tablet 0   valACYclovir (VALTREX) 1000 MG tablet Take 1 tablet (1,000 mg total) by mouth 3 (three) times daily. 21 tablet 0   No current facility-administered medications on file prior to visit.    Review of Systems  Constitutional:  Positive for fatigue. Negative for activity change, appetite change, fever and unexpected weight change.  HENT:  Positive for ear pain. Negative for congestion, ear discharge, facial swelling, rhinorrhea, sinus pressure, sore throat, tinnitus, trouble swallowing and voice change.   Eyes:  Negative for pain, redness and visual disturbance.  Respiratory:  Negative for cough, shortness of breath and wheezing.   Cardiovascular:  Negative for chest pain and palpitations.  Gastrointestinal:  Negative for abdominal pain, blood in stool, constipation and diarrhea.  Endocrine: Negative for polydipsia and polyuria.  Genitourinary:  Negative for dysuria, frequency and urgency.  Musculoskeletal:  Positive for myalgias. Negative for arthralgias and back pain.  Skin:  Negative for color change, pallor and rash.  Allergic/Immunologic: Negative for environmental allergies.  Neurological:   Positive for weakness. Negative for dizziness, syncope and headaches.  Hematological:  Negative for adenopathy. Does not bruise/bleed easily.  Psychiatric/Behavioral:  Negative for decreased concentration and dysphoric mood. The patient is not nervous/anxious.        Objective:   Physical Exam Constitutional:      General: She is not in acute distress.    Appearance: Normal appearance. She is well-developed. She is not ill-appearing or diaphoretic.  HENT:     Head: Normocephalic and atraumatic.     Comments: Mild left facial droop  Is almost able to close left eye today without assistance (per pt improved) No drooling No sensory changes  Right Ear: Tympanic membrane, ear canal and external ear normal.     Left Ear: Tympanic membrane, ear canal and external ear normal.     Nose: Nose normal.     Mouth/Throat:     Mouth: Mucous membranes are moist.     Pharynx: Oropharynx is clear.     Comments: Tongue is clear  Eyes:     General: No scleral icterus.       Right eye: No discharge.        Left eye: No discharge.     Conjunctiva/sclera: Conjunctivae normal.     Pupils: Pupils are equal, round, and reactive to light.  Neck:     Thyroid: No thyromegaly.     Vascular: No carotid bruit or JVD.  Cardiovascular:     Rate and Rhythm: Normal rate and regular rhythm.     Heart sounds: Normal heart sounds.     No gallop.  Pulmonary:     Effort: Pulmonary effort is normal. No respiratory distress.     Breath sounds: Normal breath sounds. No wheezing or rales.  Abdominal:     General: There is no distension or abdominal bruit.     Palpations: Abdomen is soft.  Musculoskeletal:     Cervical back: Normal range of motion and neck supple.     Right lower leg: No edema.     Left lower leg: No edema.  Lymphadenopathy:     Cervical: No cervical adenopathy.  Skin:    General: Skin is warm and dry.     Coloration: Skin is not pale.     Findings: No rash.     Comments: No rash  No  signs of shingles  Neurological:     Mental Status: She is alert.     Cranial Nerves: Cranial nerve deficit present.     Coordination: Coordination normal.     Gait: Gait normal.     Deep Tendon Reflexes: Reflexes are normal and symmetric. Reflexes normal.     Comments: Left sided facial paralysis/ partial/ very mild  Psychiatric:        Mood and Affect: Mood normal.           Assessment & Plan:   Problem List Items Addressed This Visit       Digestive   Ginette Pitman    This was dx in UC (with GI virus) Continues nystatin solution (swish/swallow) and improving clincially        Nervous and Auditory   Bell's palsy - Primary    This developed s/p GI virus  First seen at [facility] then ER  Reviewed hospital records, lab results and studies in detail   Reassuring work up  Reassuring exam today- her facial paralysis is mild and has started to improve  Unfortunately pt is intol to steroids and declined them Also started valcyclovir 4 d ago and is not tolerating (nausea/diarrhea/ malaise) Pt thinks the side effects are worse than the bells palsy symptoms   After discussion of pros/cons-instructed to hold the valcyclovir and monitor closely  Seeing oph-has follow up tomorrow Had ref to neuro- unsure if she will need to go  Will monitor and update based on further improvement  Update if not starting to improve in a week or if worsening  Call back and Er precautions noted in detail today    Will cc her pcp

## 2022-11-29 NOTE — Assessment & Plan Note (Signed)
This was dx in UC (with GI virus) Continues nystatin solution (swish/swallow) and improving clincially

## 2023-02-10 ENCOUNTER — Other Ambulatory Visit: Payer: Self-pay | Admitting: Primary Care

## 2023-02-10 DIAGNOSIS — F411 Generalized anxiety disorder: Secondary | ICD-10-CM

## 2023-02-10 NOTE — Telephone Encounter (Signed)
 Patient is due for CPE/follow up in late January 2025, this will be required prior to any further refills.  Please schedule, thank you!

## 2023-02-11 NOTE — Telephone Encounter (Signed)
Patient has been scheduled

## 2023-03-07 ENCOUNTER — Other Ambulatory Visit: Payer: Self-pay | Admitting: Primary Care

## 2023-03-07 DIAGNOSIS — F411 Generalized anxiety disorder: Secondary | ICD-10-CM

## 2023-04-01 ENCOUNTER — Encounter: Payer: Self-pay | Admitting: Primary Care

## 2023-04-01 ENCOUNTER — Ambulatory Visit: Payer: BC Managed Care – PPO | Admitting: Primary Care

## 2023-04-01 VITALS — BP 118/74 | HR 91 | Temp 97.7°F | Ht 63.0 in | Wt 173.0 lb

## 2023-04-01 DIAGNOSIS — F411 Generalized anxiety disorder: Secondary | ICD-10-CM | POA: Diagnosis not present

## 2023-04-01 DIAGNOSIS — F3342 Major depressive disorder, recurrent, in full remission: Secondary | ICD-10-CM | POA: Diagnosis not present

## 2023-04-01 DIAGNOSIS — Z1211 Encounter for screening for malignant neoplasm of colon: Secondary | ICD-10-CM

## 2023-04-01 DIAGNOSIS — Z131 Encounter for screening for diabetes mellitus: Secondary | ICD-10-CM | POA: Diagnosis not present

## 2023-04-01 DIAGNOSIS — Z0000 Encounter for general adult medical examination without abnormal findings: Secondary | ICD-10-CM | POA: Diagnosis not present

## 2023-04-01 DIAGNOSIS — K219 Gastro-esophageal reflux disease without esophagitis: Secondary | ICD-10-CM | POA: Diagnosis not present

## 2023-04-01 LAB — LIPID PANEL
Cholesterol: 203 mg/dL — ABNORMAL HIGH (ref 0–200)
HDL: 57.3 mg/dL (ref >39.00)
LDL Cholesterol: 128 mg/dL — ABNORMAL HIGH (ref 0–99)
NonHDL: 145.4
Total CHOL/HDL Ratio: 4
Triglycerides: 86 mg/dL (ref 0.0–149.0)
VLDL: 17.2 mg/dL (ref 0.0–40.0)

## 2023-04-01 LAB — COMPREHENSIVE METABOLIC PANEL
ALT: 17 U/L (ref 0–35)
AST: 20 U/L (ref 0–37)
Albumin: 4.2 g/dL (ref 3.5–5.2)
Alkaline Phosphatase: 78 U/L (ref 39–117)
BUN: 12 mg/dL (ref 6–23)
CO2: 32 meq/L (ref 19–32)
Calcium: 9.3 mg/dL (ref 8.4–10.5)
Chloride: 102 meq/L (ref 96–112)
Creatinine, Ser: 0.79 mg/dL (ref 0.40–1.20)
GFR: 86.7 mL/min (ref >60.00)
Glucose, Bld: 94 mg/dL (ref 70–99)
Potassium: 4.3 meq/L (ref 3.5–5.1)
Sodium: 141 meq/L (ref 135–145)
Total Bilirubin: 0.5 mg/dL (ref 0.2–1.2)
Total Protein: 7.2 g/dL (ref 6.0–8.3)

## 2023-04-01 LAB — CBC
HCT: 41.4 % (ref 36.0–46.0)
Hemoglobin: 13.6 g/dL (ref 12.0–15.0)
MCHC: 33 g/dL (ref 30.0–36.0)
MCV: 94.1 fL (ref 78.0–100.0)
Platelets: 258 10*3/uL (ref 150.0–400.0)
RBC: 4.4 Mil/uL (ref 3.87–5.11)
RDW: 13.9 % (ref 11.5–15.5)
WBC: 4.2 10*3/uL (ref 4.0–10.5)

## 2023-04-01 LAB — HEMOGLOBIN A1C: Hgb A1c MFr Bld: 5.6 % (ref 4.6–6.5)

## 2023-04-01 NOTE — Assessment & Plan Note (Signed)
Declines all vaccines. Mammogram scheduled at GYN office. Colonoscopy overdue, she continues to decline but agrees for Cologuard.  Discussed the importance of a healthy diet and regular exercise in order for weight loss, and to reduce the risk of further co-morbidity.  Exam stable. Labs pending.  Follow up in 1 year for repeat physical.

## 2023-04-01 NOTE — Assessment & Plan Note (Signed)
Controlled.  Continue fluoxetine 20 mg daily.

## 2023-04-01 NOTE — Assessment & Plan Note (Signed)
Controlled.  Continue Zegerid 40-1100 mg daily.

## 2023-04-01 NOTE — Progress Notes (Signed)
Subjective:    Patient ID: Amanda Ho, female    DOB: [birth year], 53 y.o.   MRN: [id number]  Otalgia  Pertinent negatives include no coughing, diarrhea, headaches, rash or rhinorrhea.    Amanda Ho is a very pleasant 53 y.o. female who presents today for complete physical and follow up of chronic conditions.   Immunizations: -Tetanus: Completed in 2024 -Influenza: Declines influenza vaccine.  -Shingles: Never completed, declines   Diet: Fair diet.  Exercise: No regular exercise.  Eye exam: Completes annually  Dental exam: Completed 2 years ago   Pap Smear: Hysterectomy Mammogram: Completed in March 2024, scheduled for March 2025  Colonoscopy: Never completed  BP Readings from Last 3 Encounters:  04/01/23 118/74  11/29/22 126/68  11/25/22 (!) 157/89        Review of Systems  Constitutional:  Negative for unexpected weight change.  HENT:  Positive for ear pain. Negative for rhinorrhea.   Respiratory:  Negative for cough and shortness of breath.   Cardiovascular:  Negative for chest pain.  Gastrointestinal:  Negative for constipation and diarrhea.  Genitourinary:  Negative for difficulty urinating.  Musculoskeletal:  Negative for arthralgias and myalgias.  Skin:  Negative for rash.  Allergic/Immunologic: Negative for environmental allergies.  Neurological:  Negative for dizziness, numbness and headaches.  Psychiatric/Behavioral:  The patient is not nervous/anxious.          Past Medical History:  Diagnosis Date   Acute pain of left foot 03/09/2022   Anemia    Anxiety    Asthma    rarely with URIs   Colitis 06/01/2019   Family history of anesthesia complication    Vertigo postop   Frequent headaches    GERD (gastroesophageal reflux disease)    Lower extremity pain 10/12/2019   Thrush 11/29/2022    Social History   Socioeconomic History   Marital status: Married    Spouse name: Not on file   Number of children: Not on file    Years of education: Not on file   Highest education level: Associate degree: occupational, Scientist, product/process development, or vocational program  Occupational History   Not on file  Tobacco Use   Smoking status: Never   Smokeless tobacco: Never  Substance and Sexual Activity   Alcohol use: Yes    Alcohol/week: 1.0 standard drink of alcohol    Types: 1 Glasses of wine per week    Comment: socially   Drug use: No   Sexual activity: Not on file  Other Topics Concern   Not on file  Social History Narrative   Married.   2 children. 4 grandchildren.   Works as a Librarian, academic.    Enjoys reading, going to the farmers market, relaxing.   Social Drivers of Corporate investment banker Strain: Low Risk  (03/30/2023)   Overall Financial Resource Strain (CARDIA)    Difficulty of Paying Living Expenses: Not very hard  Food Insecurity: No Food Insecurity (03/30/2023)   Hunger Vital Sign    Worried About Running Out of Food in the Last Year: Never true    Ran Out of Food in the Last Year: Never true  Transportation Needs: No Transportation Needs (03/30/2023)   PRAPARE - Administrator, Civil Service (Medical): No    Lack of Transportation (Non-Medical): No  Physical Activity: Insufficiently Active (03/30/2023)   Exercise Vital Sign    Days of Exercise per Week: 1 day    Minutes of Exercise per Session: 10 min  Stress:  No Stress Concern Present (03/30/2023)   [employer] of Occupational Health - Occupational Stress Questionnaire    Feeling of Stress : Only a little  Social Connections: Moderately Isolated (03/30/2023)   Social Connection and Isolation Panel [NHANES]    Frequency of Communication with Friends and Family: Twice a week    Frequency of Social Gatherings with Friends and Family: Once a week    Attends Religious Services: Never    Database administrator or Organizations: No    Attends Engineer, structural: Not on file    Marital Status: Married  Catering manager Violence:  Not on file    Past Surgical History:  Procedure Laterality Date   ABDOMINAL HYSTERECTOMY  2013   CESAREAN SECTION     x2    LAPAROSCOPIC ASSISTED VAGINAL HYSTERECTOMY  01/12/2012   Procedure: LAPAROSCOPIC ASSISTED VAGINAL HYSTERECTOMY;  Surgeon: Turner Daniels, MD;  Location: [facility];  Service: Gynecology;  Laterality: N/A;   TUBAL LIGATION      Family History  Problem Relation Age of Onset   Arthritis Mother    Breast cancer Mother    Mental illness Mother    Mental illness Father    Stroke Father    Liver disease Father        NAFLD   Bone cancer Father    Kidney cancer Father    Liver disease Sister        NAFLD   Hypertension Brother    Breast cancer Maternal Grandmother    Heart disease Maternal Grandmother    Hypertension Maternal Grandmother    Alcohol abuse Maternal Grandfather    Parkinson's disease Paternal Aunt     Allergies  Allergen Reactions   Bacid Diarrhea   Augmentin [Amoxicillin-Pot Clavulanate] Nausea And Vomiting   Doxycycline Nausea And Vomiting   Erythromycin Nausea And Vomiting   Etodolac Other (See Comments)    Dizziness   Flagyl [Metronidazole] Nausea And Vomiting    Both oral and cream    Gatifloxacin Other (See Comments)   Hydrocodone-Acetaminophen Nausea And Vomiting   Ibuprofen Nausea And Vomiting   Morphine And Codeine Other (See Comments)    Sleeps hard, can't wake up   Prednisone Nausea And Vomiting   Sulfa Antibiotics Nausea And Vomiting   Talwin [Pentazocine] Nausea And Vomiting   Sudafed [Pseudoephedrine Hcl] Palpitations    Tachycardia    Current Outpatient Medications on File Prior to Visit  Medication Sig Dispense Refill   acetaminophen (TYLENOL) 325 MG tablet Take 650 mg by mouth every 6 (six) hours as needed for mild pain or headache.     diphenhydramine-acetaminophen (TYLENOL PM) 25-500 MG TABS tablet Take 0.5 tablets by mouth at bedtime as needed (pain/sleep).     FLUoxetine (PROZAC) 20 MG capsule Take 1 capsule by  mouth daily for anxiety and depression. 90 capsule 0   omeprazole-sodium bicarbonate (ZEGERID) 40-1100 MG per capsule Take 1 capsule by mouth daily before breakfast.     No current facility-administered medications on file prior to visit.    BP 118/74   Pulse 91   Temp 97.7 F (36.5 C) (Temporal)   Ht 5\' 3"  (1.6 m)   Wt 173 lb (78.5 kg)   LMP 12/21/2011   SpO2 98%   BMI 30.65 kg/m  Objective:   Physical Exam HENT:     Right Ear: Tympanic membrane and ear canal normal.     Left Ear: Tympanic membrane and ear canal normal.  Eyes:  Pupils: Pupils are equal, round, and reactive to light.  Cardiovascular:     Rate and Rhythm: Normal rate and regular rhythm.  Pulmonary:     Effort: Pulmonary effort is normal.     Breath sounds: Normal breath sounds.  Abdominal:     General: Bowel sounds are normal.     Palpations: Abdomen is soft.     Tenderness: There is no abdominal tenderness.  Musculoskeletal:        General: Normal range of motion.     Cervical back: Neck supple.  Skin:    General: Skin is warm and dry.  Neurological:     Mental Status: She is alert and oriented to person, place, and time.     Cranial Nerves: No cranial nerve deficit.     Deep Tendon Reflexes:     Reflex Scores:      Patellar reflexes are 2+ on the right side and 2+ on the left side. Psychiatric:        Mood and Affect: Mood normal.           Assessment & Plan:  Preventative health care Assessment & Plan: Declines all vaccines. Mammogram scheduled at GYN office. Colonoscopy overdue, she continues to decline but agrees for Cologuard.  Discussed the importance of a healthy diet and regular exercise in order for weight loss, and to reduce the risk of further co-morbidity.  Exam stable. Labs pending.  Follow up in 1 year for repeat physical.   Orders: -     Hemoglobin A1c -     Lipid panel -     Comprehensive metabolic panel -     CBC  GAD (generalized anxiety  disorder) Assessment & Plan: Controlled.  Continue fluoxetine 20 mg daily.   Recurrent major depressive disorder, in full remission [facility]) Assessment & Plan: Controlled.  Continue fluoxetine 20 mg daily.   Gastroesophageal reflux disease, unspecified whether esophagitis present Assessment & Plan: Controlled.  Continue Zegerid 40-1100 mg daily.   Screening for colon cancer -     Cologuard        Doreene Nest, NP

## 2023-04-01 NOTE — Patient Instructions (Addendum)
Stop by the lab prior to leaving today. I will notify you of your results once received.   Complete your mammogram.  Complete the Cologuard kit once received.  It was a pleasure to see you today!

## 2023-05-19 ENCOUNTER — Other Ambulatory Visit: Payer: Self-pay | Admitting: Primary Care

## 2023-05-19 DIAGNOSIS — F411 Generalized anxiety disorder: Secondary | ICD-10-CM

## 2023-06-08 DIAGNOSIS — N951 Menopausal and female climacteric states: Secondary | ICD-10-CM | POA: Diagnosis not present

## 2023-06-08 DIAGNOSIS — N644 Mastodynia: Secondary | ICD-10-CM | POA: Diagnosis not present

## 2023-09-27 DIAGNOSIS — Z01419 Encounter for gynecological examination (general) (routine) without abnormal findings: Secondary | ICD-10-CM | POA: Diagnosis not present

## 2023-09-27 DIAGNOSIS — Z1231 Encounter for screening mammogram for malignant neoplasm of breast: Secondary | ICD-10-CM | POA: Diagnosis not present

## 2023-09-27 DIAGNOSIS — Z6828 Body mass index (BMI) 28.0-28.9, adult: Secondary | ICD-10-CM | POA: Diagnosis not present

## 2023-11-24 DIAGNOSIS — N951 Menopausal and female climacteric states: Secondary | ICD-10-CM | POA: Diagnosis not present

## 2024-01-14 DIAGNOSIS — B029 Zoster without complications: Secondary | ICD-10-CM | POA: Diagnosis not present

## 2024-02-14 ENCOUNTER — Other Ambulatory Visit: Payer: Self-pay | Admitting: Primary Care

## 2024-02-14 DIAGNOSIS — F411 Generalized anxiety disorder: Secondary | ICD-10-CM

## 2024-02-15 NOTE — Telephone Encounter (Signed)
Patient is due for CPE/follow up in late February, this will be required prior to any further refills.  Please schedule, thank you!

## 2024-04-03 ENCOUNTER — Encounter: Admitting: Primary Care
